# Patient Record
Sex: Male | Born: 1965 | Race: White | Hispanic: No | Smoking: Current every day smoker
Health system: Southern US, Community
[De-identification: ages and names within clinical notes are randomized; demographics above are authoritative.]

## PROBLEM LIST (undated history)

## (undated) DIAGNOSIS — I10 Essential (primary) hypertension: Secondary | ICD-10-CM

## (undated) DIAGNOSIS — N390 Urinary tract infection, site not specified: Secondary | ICD-10-CM

## (undated) DIAGNOSIS — E785 Hyperlipidemia, unspecified: Secondary | ICD-10-CM

## (undated) HISTORY — DX: Urinary tract infection, site not specified: N39.0

## (undated) HISTORY — DX: Essential (primary) hypertension: I10

## (undated) HISTORY — DX: Hyperlipidemia, unspecified: E78.5

---

## 2000-08-12 HISTORY — PX: VASECTOMY: SHX75

## 2005-02-08 ENCOUNTER — Ambulatory Visit: Payer: Self-pay | Admitting: Internal Medicine

## 2005-02-11 ENCOUNTER — Ambulatory Visit: Payer: Self-pay | Admitting: Cardiology

## 2005-02-20 ENCOUNTER — Ambulatory Visit (HOSPITAL_COMMUNITY): Admission: RE | Admit: 2005-02-20 | Discharge: 2005-02-20 | Payer: Self-pay | Admitting: Internal Medicine

## 2014-03-16 ENCOUNTER — Encounter: Payer: Self-pay | Admitting: Physician Assistant

## 2014-03-16 ENCOUNTER — Ambulatory Visit (INDEPENDENT_AMBULATORY_CARE_PROVIDER_SITE_OTHER): Payer: 59 | Admitting: Physician Assistant

## 2014-03-16 VITALS — BP 120/80 | HR 72 | Temp 97.9°F | Resp 18 | Ht 75.0 in | Wt 262.0 lb

## 2014-03-16 DIAGNOSIS — F411 Generalized anxiety disorder: Secondary | ICD-10-CM

## 2014-03-16 DIAGNOSIS — E041 Nontoxic single thyroid nodule: Secondary | ICD-10-CM

## 2014-03-16 DIAGNOSIS — M25519 Pain in unspecified shoulder: Secondary | ICD-10-CM

## 2014-03-16 DIAGNOSIS — M25512 Pain in left shoulder: Secondary | ICD-10-CM

## 2014-03-16 HISTORY — DX: Nontoxic single thyroid nodule: E04.1

## 2014-03-16 MED ORDER — BUSPIRONE HCL 7.5 MG PO TABS: 7.5000 mg | ORAL_TABLET | Freq: Two times a day (BID) | ORAL | Status: DC

## 2014-03-16 NOTE — Patient Instructions (Addendum)
For your anxiety, we will try buspirone 7-1/2 mg tabs twice daily. This is non-habit forming and has a very low side effect profile, seizure tolerated it well. We will reassess the effectiveness in about 2 weeks, and at the same time reassess your blood pressure and have a physical exam.  You'll be called to schedule an appointment to see orthopedics to evaluate your shoulder.  You'll be called to schedule an appointment to have an ultrasound of your thyroid to reassess for any nodule.  If emergency symptoms discussed during visit developed, seek medical attention immediately.  Followup about 2 weeks to reassess, or for worsening or persistent symptoms despite treatment.   Panic Attacks Panic attacks are sudden, short feelings of great fear or discomfort. You may have them for no reason when you are relaxed, when you are uneasy (anxious), or when you are sleeping.  HOME CARE  Take all your medicines as told.  Check with your doctor before starting new medicines.  Keep all doctor visits. GET HELP IF:  You are not able to take your medicines as told.  Your symptoms do not get better.  Your symptoms get worse. GET HELP RIGHT AWAY IF:  Your attacks seem different than your normal attacks.  You have thoughts about hurting yourself or others.  You take panic attack medicine and you have a side effect. MAKE SURE YOU:  Understand these instructions.  Will watch your condition.  Will get help right away if you are not doing well or get worse. Document Released: 08/31/2010 Document Revised: 05/19/2013 Document Reviewed: 03/12/2013 [facility] Patient Information 2015 [place], [place]. This information is not intended to replace advice given to you by your health care provider. Make sure you discuss any questions you have with your health care provider.

## 2014-03-16 NOTE — Progress Notes (Signed)
Subjective:    Patient ID: Michael Ritter, male    DOB: [birth year], 48 y.o.   MRN: [id number]  HPI Patient presents to clinic today to establish care.  Acute Concerns: Injured Shoulder- Left shoulder. No specific trauma, cleans carpet on the side, aggravates shoulder. States that it has been hurting for about 6 months. Hurts with certain movements. Moderate pain, sharp with movements. States he has been taking ibuprofen for this and it helps some. He also feels the shoulder pain effecting his neck on the same side and gives him a neck/headache. He denies numbness and tingling, and shooting pain down his arm or back.  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 He reported feeling better after about an hour and so he did not recheck it that night. He rechecked his BP last night around 11:30pm due to feeling a headache again, and his BP was 160/99. Today he is normotensive. He has never been on treatment for HTN. He feels that this only happens when he is at work, and feels that stress and anxiety over his job are a major culprit. He states that he starts thinking about the his job and it causes him to become more and more stressed until he develops a headache. Pt states he never has these episodes at home, only at work. Pt denies helplessness, hopelessness, SI, or HI.    Chronic Issues: Thyroid Nodule- Found after a neck injury. [place] of neck discovered a small nodule, per pt. A biopsy was recommended, however pt never had this done.    Health Maintenance: Dental -- Does not have a regular dentist. Vision -- Has reading glasses Immunizations -- UTD Colonoscopy -- No family history of colon cancer, colonoscopy at 48 y/o.     Review of Systems Patient denies fevers, chills,  nausea, vomiting, diarrhea, chest pain, shortness of breath, orthopnea, syncope. Denies lower extremity edema, abdominal pain, change in appetite, change in bowel movements. Patient denies rashes. No other specific complaints in a complete review of systems.     Past Medical History  Diagnosis Date  . Urinary tract infection     History   Social History  . Marital Status: Married    Spouse Name: N/A    Number of Children: N/A  . Years of Education: N/A   Occupational History  . Not on file.   Social History Main Topics  . Smoking status: Current Every Ritter Smoker  . Smokeless tobacco: Not on file  . Alcohol Use: No  . Drug Use: No  . Sexual Activity: Not on file   Other Topics Concern  . Not on file   Social History Narrative  . No narrative on file    Past Surgical History  Procedure Laterality Date  . Vasectomy  2002    Family History  Problem Relation Age of Onset  . Hypertension Father     Allergies  Allergen Reactions  . Penicillins     Other reaction(s): Unknown    No current outpatient prescriptions on file prior to visit.   No current facility-administered medications on file prior to visit.   The PFS was reviewed with the pt at time of visit.   EXAM: BP 120/80  Pulse  72  Temp(Src) 97.9 F (36.6 C) (Oral)  Resp 18  Ht 6\' 3"  (1.905 m)  Wt 262 lb (118.842 kg)  BMI 32.75 kg/m2  SpO2 95%     Objective:   Physical Exam  Nursing note and vitals reviewed. Constitutional: He is oriented to person, place, and time. He appears well-developed and well-nourished. No distress.  HENT:  Head: Normocephalic and atraumatic.  Eyes: Conjunctivae and EOM are normal. Pupils are equal, round, and reactive to light.  Neck: Normal range of motion. Neck supple. No thyromegaly present.  Cardiovascular: Normal rate, regular rhythm and intact distal pulses.   Pulmonary/Chest: Effort normal and breath sounds normal. No stridor. No respiratory distress. He  exhibits no tenderness.  Musculoskeletal: He exhibits no edema and no tenderness.  ROM in left shoulder limited. Pt unable to fully abduct shoulder past 90 degrees.  Lymphadenopathy:    He has no cervical adenopathy.  Neurological: He is alert and oriented to person, place, and time.  Skin: Skin is warm and dry. No rash noted. He is not diaphoretic. No erythema. No pallor.  Psychiatric: He has a normal mood and affect. His behavior is normal. Judgment and thought content normal.    No results found for this basename: WBC, HGB, HCT, PLT, GLUCOSE, CHOL, TRIG, HDL, LDLDIRECT, LDLCALC, ALT, AST, NA, K, CL, CREATININE, BUN, CO2, TSH, PSA, INR, GLUF, HGBA1C, MICROALBUR         Assessment & Plan:  Michael Ritter was seen today for establish care.  Diagnoses and associated orders for this visit:  Thyroid nodule Comments: Will obtian US to reassess. - US Soft Tissue Head/Neck; Future  Anxiety state, unspecified Comments: Probable cause of headache and high BPs at work. Will try Buspirone, follow up in 2 weeks to reassess. - busPIRone (BUSPAR) 7.5 MG tablet; Take 1 tablet (7.5 mg total) by mouth 2 (two) times daily.  Pain in joint, shoulder region, left Comments: Limited abduction. Possibly RC related. Refer to orthopedics. - Ambulatory referral to Orthopedic Surgery    Pt will schedule physical prior to leaving today.  Return precautions provided, and patient handout on panic attacks.  Plan to follow up in about 2 weeks to reassess, or for worsening or persistent symptoms despite treatment.  Patient Instructions  For your anxiety, we will try buspirone 7-1/2 mg tabs twice daily. This is non-habit forming and has a very low side effect profile, seizure tolerated it well. We will reassess the effectiveness in about 2 weeks, and at the same time reassess your blood pressure and have a physical exam.  You'll be called to schedule an appointment to see orthopedics to evaluate your  shoulder.  You'll be called to schedule an appointment to have an ultrasound of your thyroid to reassess for any nodule.  If emergency symptoms discussed during visit developed, seek medical attention immediately.  Followup about 2 weeks to reassess, or for worsening or persistent symptoms despite treatment.

## 2014-03-16 NOTE — Progress Notes (Signed)
Pre visit review using our clinic review tool, if applicable. No additional management support is needed unless otherwise documented below in the visit note. 

## 2014-04-05 ENCOUNTER — Encounter: Payer: 59 | Admitting: Physician Assistant

## 2014-04-05 DIAGNOSIS — Z0289 Encounter for other administrative examinations: Secondary | ICD-10-CM

## 2014-04-12 ENCOUNTER — Encounter (HOSPITAL_COMMUNITY): Payer: Self-pay | Admitting: Emergency Medicine

## 2014-04-12 ENCOUNTER — Emergency Department (HOSPITAL_COMMUNITY)
Admission: EM | Admit: 2014-04-12 | Discharge: 2014-04-12 | Disposition: A | Discharge status: Home / Self Care | Payer: PRIVATE HEALTH INSURANCE | Attending: Emergency Medicine | Admitting: Emergency Medicine

## 2014-04-12 ENCOUNTER — Emergency Department (HOSPITAL_COMMUNITY): Payer: PRIVATE HEALTH INSURANCE

## 2014-04-12 DIAGNOSIS — F17200 Nicotine dependence, unspecified, uncomplicated: Secondary | ICD-10-CM | POA: Diagnosis not present

## 2014-04-12 DIAGNOSIS — Y9229 Other specified public building as the place of occurrence of the external cause: Secondary | ICD-10-CM | POA: Insufficient documentation

## 2014-04-12 DIAGNOSIS — S4980XA Other specified injuries of shoulder and upper arm, unspecified arm, initial encounter: Secondary | ICD-10-CM | POA: Insufficient documentation

## 2014-04-12 DIAGNOSIS — IMO0002 Reserved for concepts with insufficient information to code with codable children: Secondary | ICD-10-CM | POA: Diagnosis not present

## 2014-04-12 DIAGNOSIS — S46909A Unspecified injury of unspecified muscle, fascia and tendon at shoulder and upper arm level, unspecified arm, initial encounter: Secondary | ICD-10-CM | POA: Diagnosis present

## 2014-04-12 DIAGNOSIS — Z87440 Personal history of urinary (tract) infections: Secondary | ICD-10-CM | POA: Insufficient documentation

## 2014-04-12 DIAGNOSIS — Z79899 Other long term (current) drug therapy: Secondary | ICD-10-CM | POA: Diagnosis not present

## 2014-04-12 DIAGNOSIS — Z880 Allergy status to penicillin: Secondary | ICD-10-CM | POA: Diagnosis not present

## 2014-04-12 DIAGNOSIS — Y9389 Activity, other specified: Secondary | ICD-10-CM | POA: Diagnosis not present

## 2014-04-12 DIAGNOSIS — S43402A Unspecified sprain of left shoulder joint, initial encounter: Secondary | ICD-10-CM

## 2014-04-12 MED ORDER — IBUPROFEN 800 MG PO TABS
800.0000 mg | ORAL_TABLET | Freq: Once | ORAL | Status: AC
  Administered 2014-04-12: 800 mg via ORAL
  Filled 2014-04-12: qty 1
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
## 2014-04-12 NOTE — ED Notes (Signed)
Patient offered ice pack or hot pack, declined at this time.

## 2014-04-12 NOTE — ED Notes (Signed)
Patient transported to X-ray 

## 2014-04-12 NOTE — Discharge Instructions (Signed)
Shoulder Sprain °A shoulder sprain is the result of damage to the tough, fiber-like tissues (ligaments) that help hold your shoulder in place. The ligaments may be stretched or torn. Besides the main shoulder joint (the ball and socket), there are several smaller joints that connect the bones in this area. A sprain usually involves one of those joints. Most often it is the acromioclavicular (or AC) joint. That is the joint that connects the collarbone (clavicle) and the shoulder blade (scapula) at the top point of the shoulder blade (acromion). °A shoulder sprain is a mild form of what is called a shoulder separation. Recovering from a shoulder sprain may take some time. For some, pain lingers for several months. Most people recover without long term problems. °CAUSES  °· A shoulder sprain is usually caused by some kind of trauma. This might be: °¨ Falling on an outstretched arm. °¨ Being hit hard on the shoulder. °¨ Twisting the arm. °· Shoulder sprains are more likely to occur in people who: °¨ Play sports. °¨ Have balance or coordination problems. °SYMPTOMS  °· Pain when you move your shoulder. °· Limited ability to move the shoulder. °· Swelling and tenderness on top of the shoulder. °· Redness or warmth in the shoulder. °· Bruising. °· A change in the shape of the shoulder. °DIAGNOSIS  °Your healthcare provider may: °· Ask about your symptoms. °· Ask about recent activity that might have caused those symptoms. °· Examine your shoulder. You may be asked to do simple exercises to test movement. The other shoulder will be examined for comparison. °· Order some tests that provide a look inside the body. They can show the extent of the injury. The tests could include: °¨ X-rays. °¨ CT (computed tomography) scan. °¨ MRI (magnetic resonance imaging) scan. °RISKS AND COMPLICATIONS °· Loss of full shoulder motion. °· Ongoing shoulder pain. °TREATMENT  °How long it takes to recover from a shoulder sprain depends on how  severe it was. Treatment options may include: °· Rest. You should not use the arm or shoulder until it heals. °· Ice. For 2 or 3 days after the injury, put an ice pack on the shoulder up to 4 times a day. It should stay on for 15 to 20 minutes each time. Wrap the ice in a towel so it does not touch your skin. °· Over-the-counter medicine to relieve pain. °· A sling or brace. This will keep the arm still while the shoulder is healing. °· Physical therapy or rehabilitation exercises. These will help you regain strength and motion. Ask your healthcare provider when it is OK to begin these exercises. °· Surgery. The need for surgery is rare with a sprained shoulder, but some people may need surgery to keep the joint in place and reduce pain. °HOME CARE INSTRUCTIONS  °· Ask your healthcare provider about what you should and should not do while your shoulder heals. °· Make sure you know how to apply ice to the correct area of your shoulder. °· Talk with your healthcare provider about which medications should be used for pain and swelling. °· If rehabilitation therapy will be needed, ask your healthcare provider to refer you to a therapist. If it is not recommended, then ask about at-home exercises. Find out when exercise should begin. °SEEK MEDICAL CARE IF:  °Your pain, swelling, or redness at the joint increases. °SEEK IMMEDIATE MEDICAL CARE IF:  °· You have a fever. °· You cannot move your arm or shoulder. °Document Released: 12/15/2008 Document   Revised: 10/21/2011 Document Reviewed: 12/15/2008 °ExitCare® Patient Information ©2015 [employer]. This information is not intended to replace advice given to you by your health care provider. Make sure you discuss any questions you have with your health care provider. ° °
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
## 2015-12-26 IMAGING — CR DG SHOULDER 2+V*L*
4 series · 4 of 4 positions shown · non-contrast
Comparison: None.

CLINICAL DATA: Left shoulder injury

EXAM:
LEFT SHOULDER - 2+ VIEW

[w shoulder internal left]
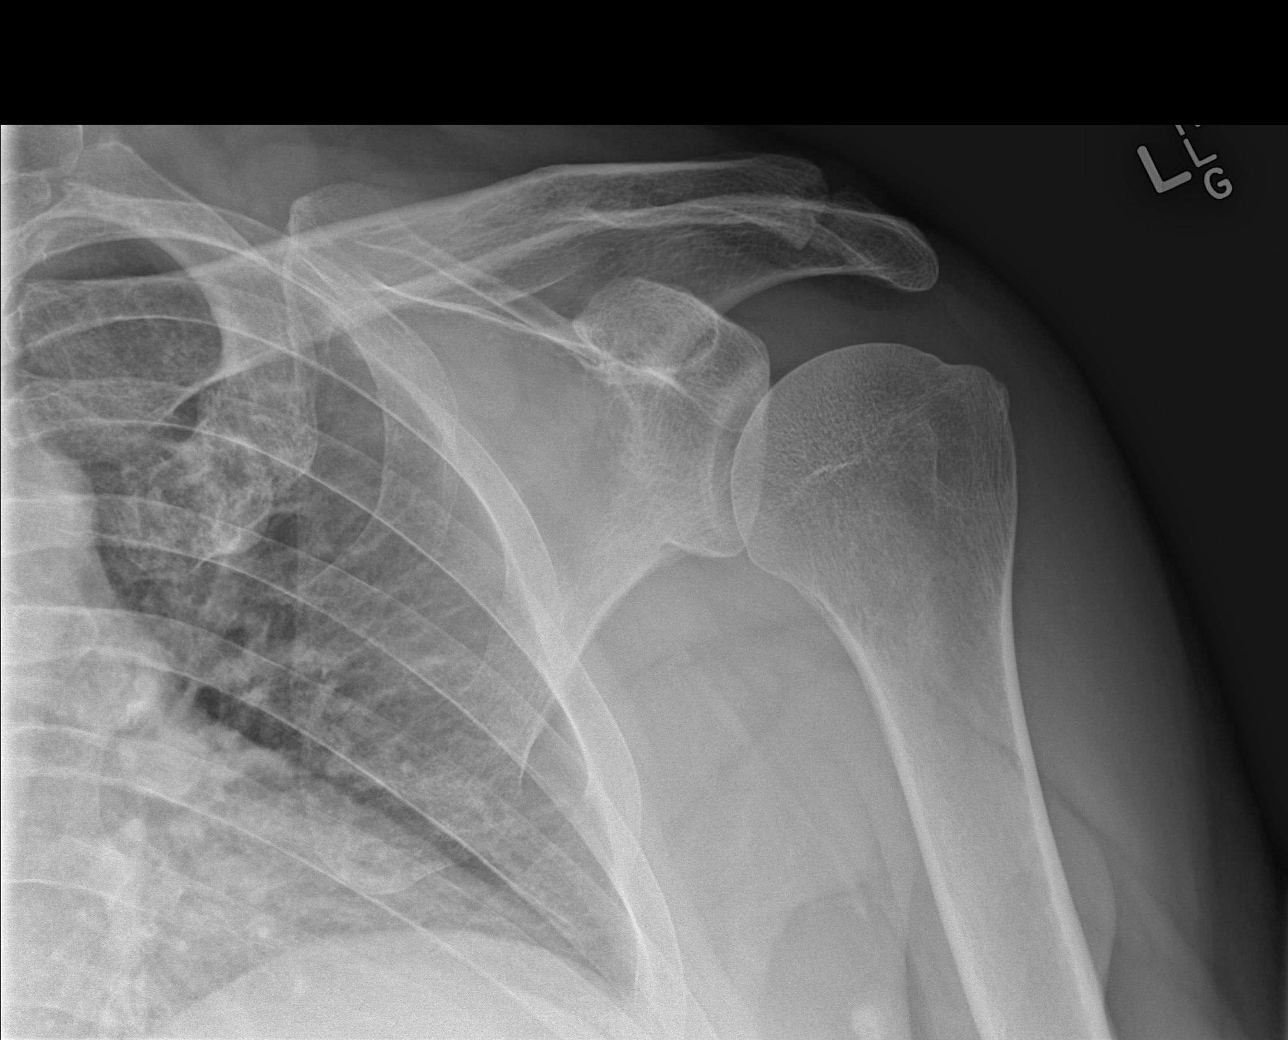

[w shoulder y-view left]
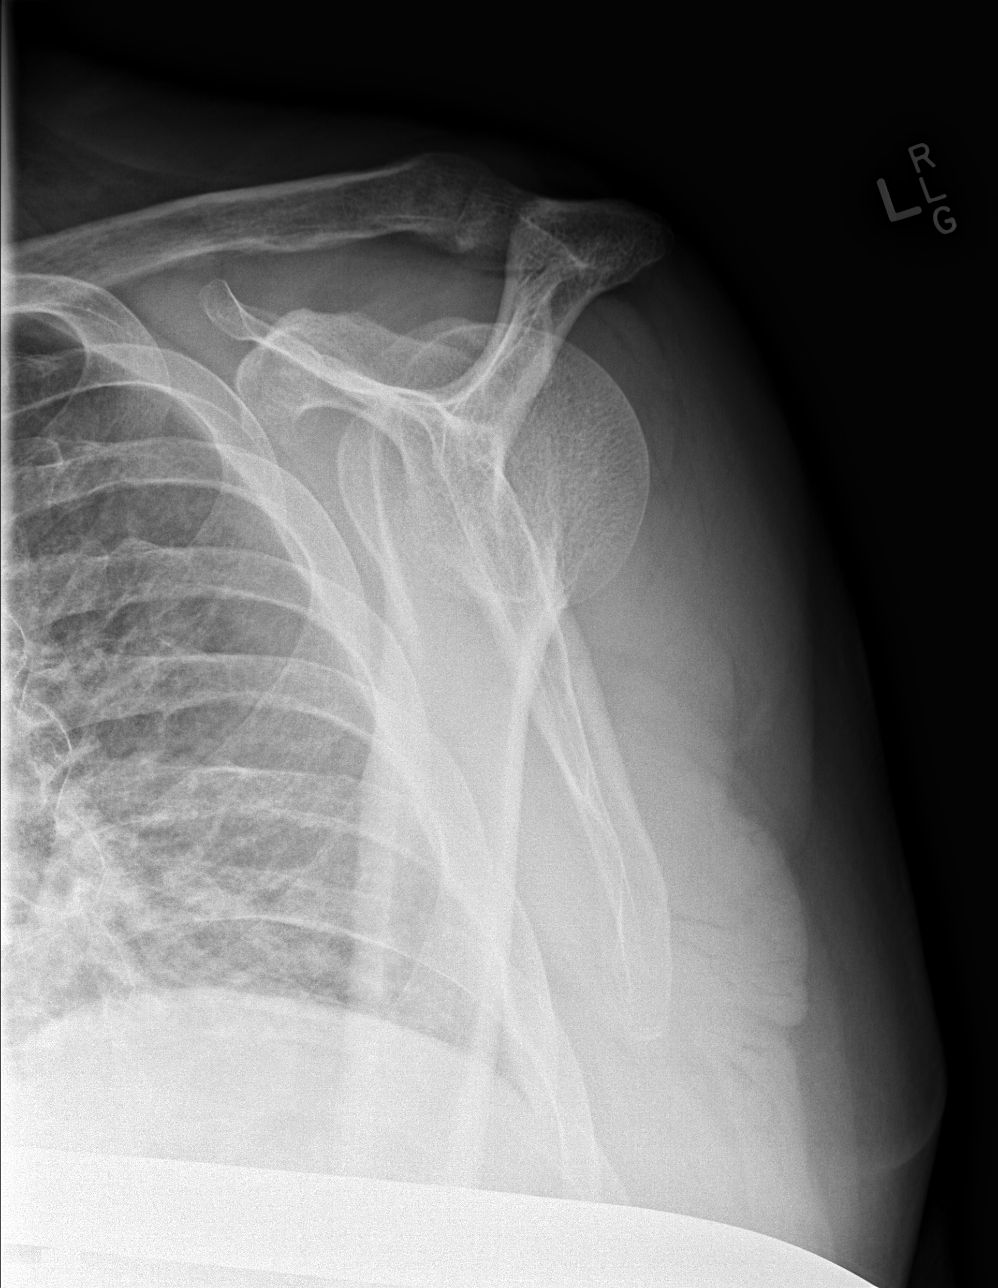

[x shoulder axillary left (1 of 2)]
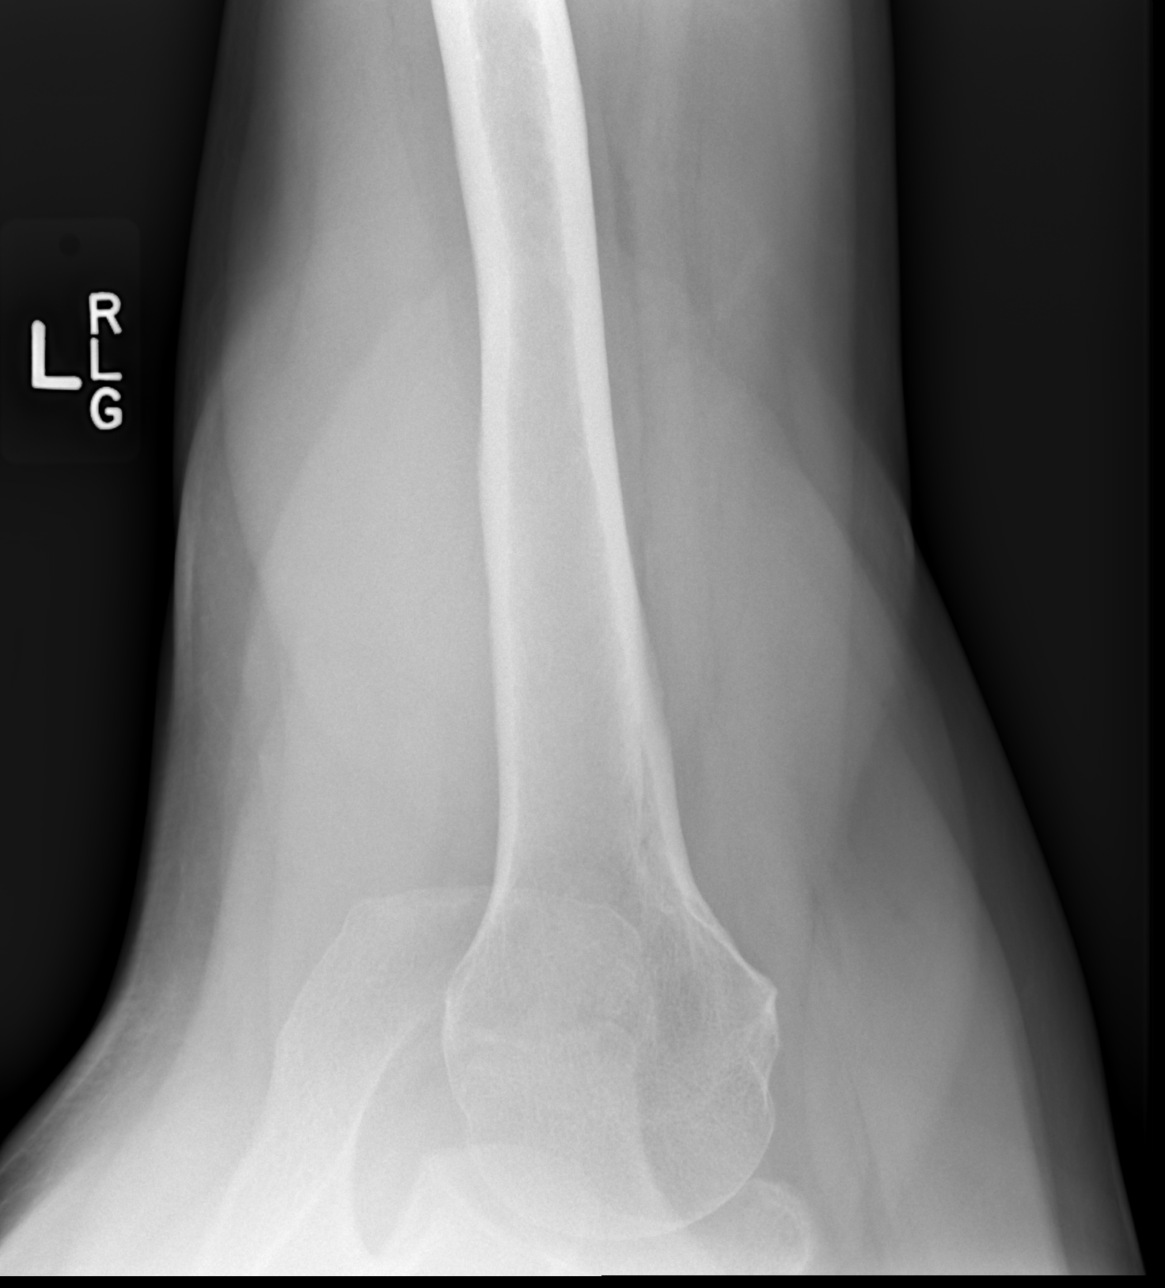

[x shoulder axillary left (2 of 2)]
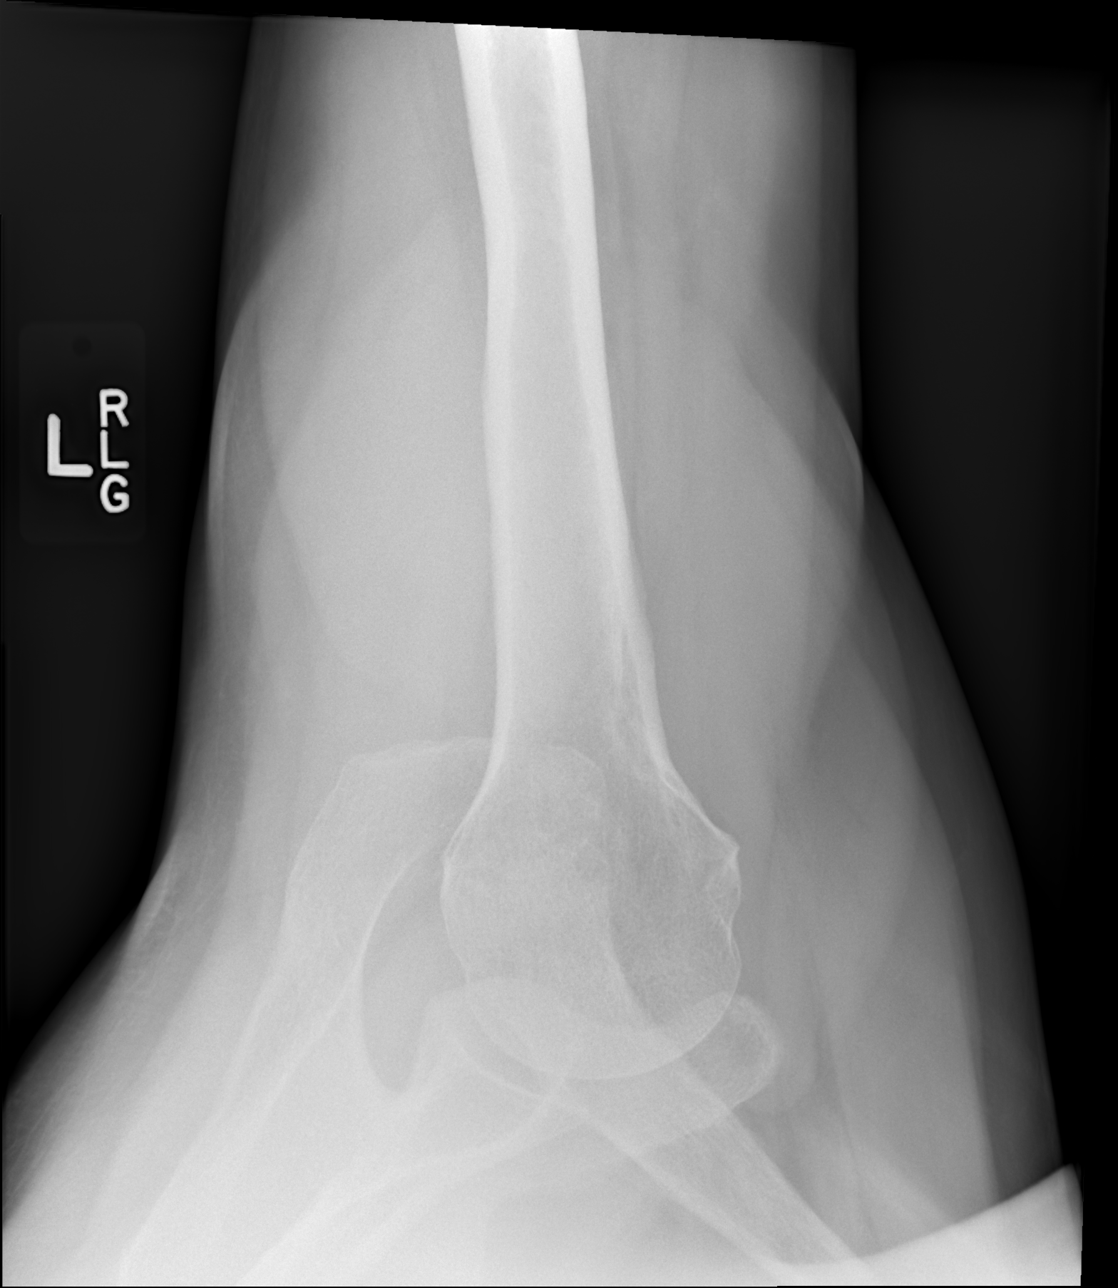

[4 of 4 positions shown; findings below may reference images not displayed]

FINDINGS: There is no evidence of fracture or dislocation. There is no
evidence of arthropathy or other focal bone abnormality. Soft
tissues are unremarkable.
IMPRESSION: Negative.

## 2023-08-14 ENCOUNTER — Inpatient Hospital Stay (HOSPITAL_COMMUNITY)
Admission: EM | Admit: 2023-08-14 | Discharge: 2023-08-15 | DRG: 065 | Disposition: A | Discharge status: Home / Self Care | Payer: Commercial Managed Care - PPO | Attending: Family Medicine | Admitting: Family Medicine

## 2023-08-14 ENCOUNTER — Emergency Department (HOSPITAL_COMMUNITY): Payer: Self-pay

## 2023-08-14 ENCOUNTER — Other Ambulatory Visit: Payer: Self-pay

## 2023-08-14 ENCOUNTER — Inpatient Hospital Stay (HOSPITAL_COMMUNITY): Payer: Commercial Managed Care - PPO

## 2023-08-14 ENCOUNTER — Encounter (HOSPITAL_COMMUNITY): Payer: Self-pay

## 2023-08-14 DIAGNOSIS — I10 Essential (primary) hypertension: Secondary | ICD-10-CM | POA: Diagnosis present

## 2023-08-14 DIAGNOSIS — Z79899 Other long term (current) drug therapy: Secondary | ICD-10-CM

## 2023-08-14 DIAGNOSIS — I6389 Other cerebral infarction: Secondary | ICD-10-CM

## 2023-08-14 DIAGNOSIS — F17210 Nicotine dependence, cigarettes, uncomplicated: Secondary | ICD-10-CM | POA: Diagnosis present

## 2023-08-14 DIAGNOSIS — I6381 Other cerebral infarction due to occlusion or stenosis of small artery: Principal | ICD-10-CM

## 2023-08-14 DIAGNOSIS — I639 Cerebral infarction, unspecified: Secondary | ICD-10-CM | POA: Diagnosis not present

## 2023-08-14 DIAGNOSIS — Z8249 Family history of ischemic heart disease and other diseases of the circulatory system: Secondary | ICD-10-CM

## 2023-08-14 DIAGNOSIS — G8191 Hemiplegia, unspecified affecting right dominant side: Secondary | ICD-10-CM | POA: Diagnosis present

## 2023-08-14 DIAGNOSIS — R0902 Hypoxemia: Secondary | ICD-10-CM | POA: Diagnosis present

## 2023-08-14 DIAGNOSIS — E119 Type 2 diabetes mellitus without complications: Secondary | ICD-10-CM

## 2023-08-14 DIAGNOSIS — R29702 NIHSS score 2: Secondary | ICD-10-CM | POA: Diagnosis present

## 2023-08-14 DIAGNOSIS — E1165 Type 2 diabetes mellitus with hyperglycemia: Secondary | ICD-10-CM | POA: Diagnosis present

## 2023-08-14 DIAGNOSIS — Z716 Tobacco abuse counseling: Secondary | ICD-10-CM

## 2023-08-14 DIAGNOSIS — R4781 Slurred speech: Secondary | ICD-10-CM | POA: Diagnosis present

## 2023-08-14 DIAGNOSIS — Z7902 Long term (current) use of antithrombotics/antiplatelets: Secondary | ICD-10-CM

## 2023-08-14 DIAGNOSIS — Z8673 Personal history of transient ischemic attack (TIA), and cerebral infarction without residual deficits: Secondary | ICD-10-CM | POA: Diagnosis present

## 2023-08-14 DIAGNOSIS — Z7982 Long term (current) use of aspirin: Secondary | ICD-10-CM

## 2023-08-14 DIAGNOSIS — R030 Elevated blood-pressure reading, without diagnosis of hypertension: Secondary | ICD-10-CM | POA: Insufficient documentation

## 2023-08-14 DIAGNOSIS — Z880 Allergy status to penicillin: Secondary | ICD-10-CM

## 2023-08-14 HISTORY — DX: Cerebral infarction, unspecified: I63.9

## 2023-08-14 LAB — COMPREHENSIVE METABOLIC PANEL
ALT: 35 U/L (ref 0–44)
AST: 30 U/L (ref 15–41)
Albumin: 4.4 g/dL (ref 3.5–5.0)
Alkaline Phosphatase: 68 U/L (ref 38–126)
Anion gap: 8 (ref 5–15)
BUN: 15 mg/dL (ref 6–20)
CO2: 24 mmol/L (ref 22–32)
Calcium: 9.5 mg/dL (ref 8.9–10.3)
Chloride: 103 mmol/L (ref 98–111)
Creatinine, Ser: 0.96 mg/dL (ref 0.61–1.24)
GFR, Estimated: >60 mL/min (ref >60)
Glucose, Bld: 171 mg/dL — ABNORMAL HIGH (ref 70–99)
Potassium: 3.8 mmol/L (ref 3.5–5.1)
Sodium: 135 mmol/L (ref 135–145)
Total Bilirubin: 0.7 mg/dL (ref 0.0–1.2)
Total Protein: 8.2 g/dL — ABNORMAL HIGH (ref 6.5–8.1)

## 2023-08-14 LAB — CBG MONITORING, ED: Glucose-Capillary: 169 mg/dL — ABNORMAL HIGH (ref 70–99)

## 2023-08-14 LAB — ECHOCARDIOGRAM COMPLETE
Area-P 1/2: 4.68 cm2
Calc EF: 57.8 %
Est EF: 55
S' Lateral: 3.5 cm
Single Plane A2C EF: 53.1 %
Single Plane A4C EF: 61 %

## 2023-08-14 LAB — I-STAT CHEM 8, ED
BUN: 14 mg/dL (ref 6–20)
Calcium, Ion: 1.18 mmol/L (ref 1.15–1.40)
Chloride: 104 mmol/L (ref 98–111)
Creatinine, Ser: 1 mg/dL (ref 0.61–1.24)
Glucose, Bld: 166 mg/dL — ABNORMAL HIGH (ref 70–99)
HCT: 47 % (ref 39.0–52.0)
Hemoglobin: 16 g/dL (ref 13.0–17.0)
Potassium: 4 mmol/L (ref 3.5–5.1)
Sodium: 141 mmol/L (ref 135–145)
TCO2: 24 mmol/L (ref 22–32)

## 2023-08-14 LAB — DIFFERENTIAL
Abs Immature Granulocytes: 0.05 10*3/uL (ref 0.00–0.07)
Basophils Absolute: 0.1 10*3/uL (ref 0.0–0.1)
Basophils Relative: 1 %
Eosinophils Absolute: 0.3 10*3/uL (ref 0.0–0.5)
Eosinophils Relative: 3 %
Immature Granulocytes: 1 %
Lymphocytes Relative: 29 %
Lymphs Abs: 2.9 10*3/uL (ref 0.7–4.0)
Monocytes Absolute: 0.7 10*3/uL (ref 0.1–1.0)
Monocytes Relative: 7 %
Neutro Abs: 6.1 10*3/uL (ref 1.7–7.7)
Neutrophils Relative %: 59 %

## 2023-08-14 LAB — CBC
HCT: 50.5 % (ref 39.0–52.0)
Hemoglobin: 16.8 g/dL (ref 13.0–17.0)
MCH: 30.3 pg (ref 26.0–34.0)
MCHC: 33.3 g/dL (ref 30.0–36.0)
MCV: 91.2 fL (ref 80.0–100.0)
Platelets: 226 10*3/uL (ref 150–400)
RBC: 5.54 MIL/uL (ref 4.22–5.81)
RDW: 13.5 % (ref 11.5–15.5)
WBC: 10 10*3/uL (ref 4.0–10.5)
nRBC: 0 % (ref 0.0–0.2)

## 2023-08-14 LAB — APTT: aPTT: 24 s (ref 24–36)

## 2023-08-14 LAB — PROTIME-INR
INR: 0.9 (ref 0.8–1.2)
Prothrombin Time: 12.8 s (ref 11.4–15.2)

## 2023-08-14 LAB — ETHANOL: Alcohol, Ethyl (B): <10 mg/dL (ref <10)

## 2023-08-14 LAB — LIPID PANEL
Cholesterol: 208 mg/dL — ABNORMAL HIGH (ref 0–200)
HDL: 46 mg/dL (ref >40)
LDL Cholesterol: 141 mg/dL — ABNORMAL HIGH (ref 0–99)
Total CHOL/HDL Ratio: 4.5 {ratio}
Triglycerides: 106 mg/dL (ref <150)
VLDL: 21 mg/dL (ref 0–40)

## 2023-08-14 MED ORDER — ACETAMINOPHEN 325 MG PO TABS
650.0000 mg | ORAL_TABLET | ORAL | Status: DC | PRN
Start: 2023-08-14 — End: 2023-08-15

## 2023-08-14 MED ORDER — CLOPIDOGREL BISULFATE 75 MG PO TABS
75.0000 mg | ORAL_TABLET | Freq: Every day | ORAL | Status: DC
  Administered 2023-08-14 – 2023-08-15 (×2): 75 mg via ORAL
  Filled 2023-08-14 (×2): qty 1

## 2023-08-14 MED ORDER — ASPIRIN 81 MG PO CHEW
81.0000 mg | CHEWABLE_TABLET | Freq: Every day | ORAL | Status: DC
  Administered 2023-08-14 – 2023-08-15 (×2): 81 mg via ORAL
  Filled 2023-08-14 (×2): qty 1

## 2023-08-14 MED ORDER — SODIUM CHLORIDE 0.9% FLUSH
3.0000 mL | Freq: Once | INTRAVENOUS | Status: AC
  Administered 2023-08-14: 3 mL via INTRAVENOUS

## 2023-08-14 MED ORDER — SENNOSIDES-DOCUSATE SODIUM 8.6-50 MG PO TABS: 1.0000 | ORAL_TABLET | Freq: Every evening | ORAL | Status: DC | PRN

## 2023-08-14 MED ORDER — IBUPROFEN 200 MG PO TABS
600.0000 mg | ORAL_TABLET | Freq: Four times a day (QID) | ORAL | Status: DC | PRN
  Filled 2023-08-14: qty 3

## 2023-08-14 MED ORDER — ATORVASTATIN CALCIUM 40 MG PO TABS
80.0000 mg | ORAL_TABLET | Freq: Every day | ORAL | Status: DC
  Administered 2023-08-14 – 2023-08-15 (×2): 80 mg via ORAL
  Filled 2023-08-14 (×2): qty 2

## 2023-08-14 MED ORDER — IOHEXOL 350 MG/ML SOLN
75.0000 mL | Freq: Once | INTRAVENOUS | Status: AC | PRN
  Administered 2023-08-14: 75 mL via INTRAVENOUS

## 2023-08-14 MED ORDER — ACETAMINOPHEN 160 MG/5ML PO SOLN
650.0000 mg | ORAL | Status: DC | PRN
Start: 2023-08-14 — End: 2023-08-15

## 2023-08-14 MED ORDER — SODIUM CHLORIDE (PF) 0.9 % IJ SOLN
INTRAMUSCULAR | Status: AC
Start: 2023-08-14 — End: ?
  Filled 2023-08-14: qty 50

## 2023-08-14 MED ORDER — ONDANSETRON HCL 4 MG/2ML IJ SOLN: 4.0000 mg | Freq: Four times a day (QID) | INTRAMUSCULAR | Status: DC | PRN

## 2023-08-14 MED ORDER — ACETAMINOPHEN 650 MG RE SUPP: 650.0000 mg | RECTAL | Status: DC | PRN

## 2023-08-14 MED ORDER — SODIUM CHLORIDE 0.9 % IV BOLUS
500.0000 mL | Freq: Once | INTRAVENOUS | Status: AC
  Administered 2023-08-14: 500 mL via INTRAVENOUS

## 2023-08-14 MED ORDER — STROKE: EARLY STAGES OF RECOVERY BOOK
Freq: Once | Status: DC
  Filled 2023-08-14: qty 1

## 2023-08-14 MED ORDER — ENOXAPARIN SODIUM 40 MG/0.4ML IJ SOSY
40.0000 mg | PREFILLED_SYRINGE | Freq: Every day | INTRAMUSCULAR | Status: DC
  Administered 2023-08-14 – 2023-08-15 (×2): 40 mg via SUBCUTANEOUS
  Filled 2023-08-14 (×2): qty 0.4

## 2023-08-14 MED ORDER — ASPIRIN 300 MG RE SUPP: 300.0000 mg | Freq: Every day | RECTAL | Status: DC

## 2023-08-14 MED ORDER — PERFLUTREN LIPID MICROSPHERE
1.0000 mL | INTRAVENOUS | Status: AC | PRN
  Administered 2023-08-14: 1 mL via INTRAVENOUS

## 2023-08-14 NOTE — ED Notes (Signed)
 Pt to CT

## 2023-08-14 NOTE — ED Provider Notes (Signed)
 [place] EMERGENCY DEPARTMENT AT [facility] Provider Note  CSN: [id number] Arrival date & time: 08/14/23 0846  Chief Complaint(s) Stroke Symptoms  HPI Michael Ritter is a 58 y.o. male with past medical history as below, significant for hypertension, vasectomy, Bacot use who presents to the ED with complaint of speech changes  Last seen normal around 7:30 - 8 PM last night, began having difficulty speaking, slurred speech.  Felt like he was slightly weak on the right side.  No falls or injuries, no thinners.  Denies similar symptoms in the past.  Symptoms have not improved since the onset.  He went bed and symptoms persisted this morning.  He also now has a slight headache left frontal.  Slurred speech continues, no vision changes, no aphasia or neglect on exam.  Past Medical History Past Medical History:  Diagnosis Date   Urinary tract infection    Patient Active Problem List   Diagnosis Date Noted   Acute CVA (cerebrovascular accident) (HCC) 08/14/2023   Thyroid nodule 03/16/2014   Home Medication(s) Prior to Admission medications   Medication Sig Start Date End Date Taking? Authorizing Provider  ibuprofen  (ADVIL ,MOTRIN ) 200 MG tablet Take 800 mg by mouth every 6 (six) hours as needed for moderate pain.   Yes [provider]  Multiple Vitamin (MULTIVITAMIN WITH MINERALS) TABS tablet Take 1 tablet by mouth daily.   Yes [provider]  busPIRone  (BUSPAR ) 7.5 MG tablet Take 1 tablet (7.5 mg total) by mouth 2 (two) times daily. Patient not taking: Reported on 08/14/2023 03/16/14   Viktoria Donnice POUR, PA-C                                                                                                                                    Past Surgical History Past Surgical History:  Procedure Laterality Date   VASECTOMY  2002   Family History Family History  Problem Relation Age of Onset   Hypertension Father     Social History Social History    Tobacco Use   Smoking status: Every Day  Substance Use Topics   Alcohol use: No   Drug use: No   Allergies Penicillins  Review of Systems Review of Systems  Respiratory:  Negative for chest tightness.   Cardiovascular:  Negative for chest pain.  Gastrointestinal:  Negative for abdominal pain.  Genitourinary:  Negative for urgency.  Musculoskeletal:  Negative for arthralgias.  Neurological:  Positive for facial asymmetry, speech difficulty, weakness and headaches.  All other systems reviewed and are negative.   Physical Exam Vital Signs  I have reviewed the triage vital signs BP (!) 201/106   Pulse 79   Temp 98.3 F (36.8 C) (Oral)   Resp 14   SpO2 95%  Physical Exam Vitals and nursing note reviewed.  Constitutional:      General: He is not in acute distress.    Appearance: He is well-developed.  HENT:     Head: Normocephalic and atraumatic.     Right Ear: External ear normal.     Left Ear: External ear normal.     Mouth/Throat:     Mouth: Mucous membranes are moist.  Eyes:     General: No visual field deficit or scleral icterus.    Extraocular Movements: Extraocular movements intact.     Pupils: Pupils are equal, round, and reactive to light.  Cardiovascular:     Rate and Rhythm: Normal rate and regular rhythm.     Pulses: Normal pulses.     Heart sounds: Normal heart sounds.  Pulmonary:     Effort: Pulmonary effort is normal. No respiratory distress.     Breath sounds: Normal breath sounds.  Abdominal:     General: Abdomen is flat.     Palpations: Abdomen is soft.     Tenderness: There is no abdominal tenderness.  Musculoskeletal:     Cervical back: No rigidity.     Right lower leg: No edema.     Left lower leg: No edema.  Skin:    General: Skin is warm and dry.     Capillary Refill: Capillary refill takes less than 2 seconds.  Neurological:     Mental Status: He is alert and oriented to person, place, and time.     GCS: GCS eye subscore is 4. GCS  verbal subscore is 5. GCS motor subscore is 6.     Cranial Nerves: Dysarthria and facial asymmetry present.     Sensory: Sensation is intact. No sensory deficit.     Motor: Weakness present.     Coordination: Coordination is intact. Finger-Nose-Finger Test normal.     Comments: Left-sided facial droop No visual field cuts Slight weakness to right upper extremity, slight drift Appears to be leaning to the right when ambulating  Psychiatric:        Mood and Affect: Mood normal.        Behavior: Behavior normal.     ED Results and Treatments Labs (all labs ordered are listed, but only abnormal results are displayed) Labs Reviewed  COMPREHENSIVE METABOLIC PANEL - Abnormal; Notable for the following components:      Result Value   Glucose, Bld 171 (*)    Total Protein 8.2 (*)    All other components within normal limits  LIPID PANEL - Abnormal; Notable for the following components:   Cholesterol 208 (*)    LDL Cholesterol 141 (*)    All other components within normal limits  CBG MONITORING, ED - Abnormal; Notable for the following components:   Glucose-Capillary 169 (*)    All other components within normal limits  I-STAT CHEM 8, ED - Abnormal; Notable for the following components:   Glucose, Bld 166 (*)    All other components within normal limits  PROTIME-INR  APTT  CBC  DIFFERENTIAL  ETHANOL  HEMOGLOBIN A1C  HIV ANTIBODY (ROUTINE TESTING W REFLEX)  CBG MONITORING, ED  Radiology ECHOCARDIOGRAM COMPLETE Result Date: 08/14/2023    ECHOCARDIOGRAM REPORT   Patient Name:   Michael Ritter Date of Exam: 08/14/2023 Medical Rec #:  [id number]       Height:       75.0 in Accession #:    [id number]      Weight:       262.0 lb Date of Birth:  03-17-1966      BSA:          2.461 m Patient Age:    57 years        BP:           180/95 mmHg Patient Gender: M                HR:           78 bpm. Exam Location:  Inpatient Procedure: 2D Echo, Cardiac Doppler, Color Doppler, Saline Contrast Bubble Study            and Intracardiac Opacification Agent Indications:    CVA  History:        Patient has no prior history of Echocardiogram examinations.                 Risk Factors:Current Smoker.  Sonographer:    Lanell Maduro Referring Phys: [id number] MIR M IKRAMULLAH IMPRESSIONS  1. Left ventricular ejection fraction, by estimation, is 55%. The left ventricle has normal function. The left ventricle has no regional wall motion abnormalities. There is moderate concentric left ventricular hypertrophy. Left ventricular diastolic parameters are consistent with Grade I diastolic dysfunction (impaired relaxation).  2. The IVC was not visualized. Right ventricular systolic function is normal. The right ventricular size is normal. Tricuspid regurgitation signal is inadequate for assessing PA pressure.  3. The mitral valve is normal in structure. No evidence of mitral valve regurgitation. No evidence of mitral stenosis.  4. The aortic valve is tricuspid. Aortic valve regurgitation is not visualized. No aortic stenosis is present.  5. Aortic dilatation noted. There is mild dilatation of the ascending aorta, measuring 38 mm.  6. Bubble study was probably negative but images were poor.  7. Technically difficult study with poor acoustic windows. FINDINGS  Left Ventricle: Left ventricular ejection fraction, by estimation, is 55%. The left ventricle has normal function. The left ventricle has no regional wall motion abnormalities. Definity  contrast agent was given IV to delineate the left ventricular endocardial borders. The left ventricular internal cavity size was normal in size. There is moderate concentric left ventricular hypertrophy. Left ventricular diastolic parameters are consistent with Grade I diastolic dysfunction (impaired relaxation). Right Ventricle: The IVC was not visualized. The right  ventricular size is normal. Right vetricular wall thickness was not well visualized. Right ventricular systolic function is normal. Tricuspid regurgitation signal is inadequate for assessing PA pressure. Left Atrium: Left atrial size was normal in size. Right Atrium: Right atrial size was normal in size. Pericardium: There is no evidence of pericardial effusion. Mitral Valve: The mitral valve is normal in structure. No evidence of mitral valve regurgitation. No evidence of mitral valve stenosis. Tricuspid Valve: The tricuspid valve is normal in structure. Tricuspid valve regurgitation is trivial. Aortic Valve: The aortic valve is tricuspid. Aortic valve regurgitation is not visualized. No aortic stenosis is present. Pulmonic Valve: The pulmonic valve was normal in structure. Pulmonic valve regurgitation is not visualized. Aorta: The aortic root is normal in size and structure and aortic dilatation noted. There is mild dilatation of the ascending aorta, measuring  38 mm. Venous: The inferior vena cava was not well visualized. IAS/Shunts: Bubble study was probably negative but images were poor. Agitated saline contrast was given intravenously to evaluate for intracardiac shunting.  LEFT VENTRICLE PLAX 2D LVIDd:         4.40 cm      Diastology LVIDs:         3.50 cm      LV e' medial:    5.87 cm/s LV PW:         1.50 cm      LV E/e' medial:  8.9 LV IVS:        1.60 cm      LV e' lateral:   5.11 cm/s LVOT diam:     2.50 cm      LV E/e' lateral: 10.3 LV SV:         97 LV SV Index:   39 LVOT Area:     4.91 cm  LV Volumes (MOD) LV vol d, MOD A2C: 125.0 ml LV vol d, MOD A4C: 144.0 ml LV vol s, MOD A2C: 58.6 ml LV vol s, MOD A4C: 56.1 ml LV SV MOD A2C:     66.4 ml LV SV MOD A4C:     144.0 ml LV SV MOD BP:      78.6 ml RIGHT VENTRICLE RV Basal diam:  3.40 cm RV S prime:     11.40 cm/s TAPSE (M-mode): 2.9 cm LEFT ATRIUM             Index        RIGHT ATRIUM           Index LA diam:        4.00 cm 1.63 cm/m   RA Area:      11.60 cm LA Vol (A2C):   44.1 ml 17.92 ml/m  RA Volume:   22.10 ml  8.98 ml/m LA Vol (A4C):   28.1 ml 11.42 ml/m LA Biplane Vol: 36.0 ml 14.63 ml/m  AORTIC VALVE LVOT Vmax:   106.00 cm/s LVOT Vmean:  72.300 cm/s LVOT VTI:    0.198 m  AORTA Ao Root diam: 3.40 cm Ao Asc diam:  3.80 cm MITRAL VALVE MV Area (PHT): 4.68 cm    SHUNTS MV Decel Time: 162 msec    Systemic VTI:  0.20 m MV E velocity: 52.40 cm/s  Systemic Diam: 2.50 cm MV A velocity: 99.00 cm/s MV E/A ratio:  0.53 Dalton McleanMD Electronically signed by Ezra Kanner Signature Date/Time: 08/14/2023/5:38:42 PM    Final    MR BRAIN WO CONTRAST Result Date: 08/14/2023 CLINICAL DATA:  Neuro deficit, acute, stroke suspected. Slurred speech and right-sided weakness. Headache. EXAM: MRI HEAD WITHOUT CONTRAST TECHNIQUE: Multiplanar, multiecho pulse sequences of the brain and surrounding structures were obtained without intravenous contrast. COMPARISON:  Head CT and CTA 08/14/2023 FINDINGS: Brain: There is a 1.5 cm acute infarct involving the posterior limb of the left internal capsule, corresponding to the asymmetric hypodensity described on today's CT. Scattered punctate T2 hyperintensities elsewhere in the cerebral white matter bilaterally are nonspecific but compatible with minimal chronic small vessel ischemic disease. Cerebral volume is within normal limits for age with normal size of the ventricles. No intracranial hemorrhage, mass, midline shift, or extra-axial fluid collection is identified. Vascular: Major intracranial vascular flow voids are preserved. Skull and upper cervical spine: Unremarkable bone marrow signal. Sinuses/Orbits: Unremarkable orbits. Moderate mucosal thickening in the paranasal sinuses. Clear mastoid air cells. Other: None. IMPRESSION: Acute left internal  capsule infarct. Electronically Signed   By: Dasie Hamburg M.D.   On: 08/14/2023 13:17   CT ANGIO HEAD NECK W WO CM Result Date: 08/14/2023 CLINICAL DATA:  Neuro deficit with  acute stroke suspected EXAM: CT ANGIOGRAPHY HEAD AND NECK WITH AND WITHOUT CONTRAST TECHNIQUE: Multidetector CT imaging of the head and neck was performed using the standard protocol during bolus administration of intravenous contrast. Multiplanar CT image reconstructions and MIPs were obtained to evaluate the vascular anatomy. Carotid stenosis measurements (when applicable) are obtained utilizing NASCET criteria, using the distal internal carotid diameter as the denominator. RADIATION DOSE REDUCTION: This exam was performed according to the departmental dose-optimization program which includes automated exposure control, adjustment of the mA and/or kV according to patient size and/or use of iterative reconstruction technique. CONTRAST:  75mL OMNIPAQUE  IOHEXOL  350 MG/ML SOLN COMPARISON:  Head CT from earlier today. FINDINGS: CTA NECK FINDINGS Aortic arch: Unremarkable with 3 vessel branching Right carotid system: Atheromatous wall thickening of the common internal carotid arteries. Mixed density plaque accentuated at the bifurcation without significant stenosis and no ulceration. No beading or dissection. Left carotid system: Atheromatous wall thickening of the common carotid with accentuated mixed density plaque at the bifurcation. No stenosis or ulceration. Vertebral arteries: The vertebral arteries are smoothly contoured and diffusely patent. No proximal subclavian stenosis. Skeleton: Unremarkable Other neck: Patchy bilateral paranasal sinus opacification, incidental to the history. Upper chest: No acute finding Review of the MIP images confirms the above findings CTA HEAD FINDINGS Anterior circulation: No significant stenosis, proximal occlusion, aneurysm, or vascular malformation. Generalized atheromatous type irregularity of medium size branches. Posterior circulation: Mild atheromatous irregularity of the posterior cerebral artery especially on the right where there is a mild-to-moderate P3 stenosis. Venous  sinuses: Unremarkable Anatomic variants: None significant Review of the MIP images confirms the above findings IMPRESSION: 1. No emergent finding. 2. Cervical and intracranial atherosclerosis without flow reducing stenosis or ulceration of major arteries in the head and neck. Electronically Signed   By: Dorn Roulette M.D.   On: 08/14/2023 11:28   CT HEAD WO CONTRAST Result Date: 08/14/2023 CLINICAL DATA:  Neuro deficit, acute, stroke suspected. Slurred speech and right-sided weakness. EXAM: CT HEAD WITHOUT CONTRAST TECHNIQUE: Contiguous axial images were obtained from the base of the skull through the vertex without intravenous contrast. RADIATION DOSE REDUCTION: This exam was performed according to the departmental dose-optimization program which includes automated exposure control, adjustment of the mA and/or kV according to patient size and/or use of iterative reconstruction technique. COMPARISON:  None Available. FINDINGS: Brain: No acute hemorrhage. Possible asymmetric hypoattenuation in the posterior limb of the left internal capsule (axial image 18 series 2). Cortical Annison Birchard-white differentiation is preserved. No hydrocephalus or extra-axial collection. No mass effect or midline shift. Vascular: No hyperdense vessel or unexpected calcification. Skull: No calvarial fracture or suspicious bone lesion. Skull base is unremarkable. Sinuses/Orbits: Mild pansinus disease.  Orbits are unremarkable. Other: None. IMPRESSION: 1. Possible asymmetric hypoattenuation in the posterior limb of the left internal capsule, which could represent an age-indeterminate infarct. Consider MRI for further evaluation. 2. No acute hemorrhage. Electronically Signed   By: Ryan Chess M.D.   On: 08/14/2023 09:23    Pertinent labs & imaging results that were available during my care of the patient were reviewed by me and considered in my medical decision making (see MDM for details).  Medications Ordered in ED Medications    stroke: early stages of recovery book (has no administration in time range)  acetaminophen  (  TYLENOL ) tablet 650 mg (has no administration in time range)    Or  acetaminophen  (TYLENOL ) 160 MG/5ML solution 650 mg (has no administration in time range)    Or  acetaminophen  (TYLENOL ) suppository 650 mg (has no administration in time range)  senna-docusate (Senokot-S) tablet 1 tablet (has no administration in time range)  enoxaparin  (LOVENOX ) injection 40 mg (40 mg Subcutaneous Given 08/14/23 1739)  ondansetron  (ZOFRAN ) injection 4 mg (has no administration in time range)  perflutren  lipid microspheres (DEFINITY ) IV suspension (1 mL Intravenous Given 08/14/23 1539)  aspirin  chewable tablet 81 mg (81 mg Oral Given 08/14/23 1738)    Or  aspirin  suppository 300 mg ( Rectal See Alternative 08/14/23 1738)  clopidogrel  (PLAVIX ) tablet 75 mg (75 mg Oral Given 08/14/23 1738)  atorvastatin  (LIPITOR) tablet 80 mg (80 mg Oral Given 08/14/23 1738)  ibuprofen  (ADVIL ) tablet 600 mg (has no administration in time range)  sodium chloride  flush (NS) 0.9 % injection 3 mL (3 mLs Intravenous Given 08/14/23 0934)  sodium chloride  0.9 % bolus 500 mL (0 mLs Intravenous Stopped 08/14/23 1015)  iohexol  (OMNIPAQUE ) 350 MG/ML injection 75 mL (75 mLs Intravenous Contrast Given 08/14/23 1040)                                                                                                                                     Procedures .Critical Care  Performed by: Elnor Jayson LABOR, DO Authorized by: Elnor Jayson LABOR, DO   Critical care provider statement:    Critical care time (minutes):  30   Critical care time was exclusive of:  Separately billable procedures and treating other patients   Critical care was necessary to treat or prevent imminent or life-threatening deterioration of the following conditions:  CNS failure or compromise   Critical care was time spent personally by me on the following activities:  Development of treatment plan  with patient or surrogate, discussions with consultants, evaluation of patient's response to treatment, examination of patient, ordering and review of laboratory studies, ordering and review of radiographic studies, ordering and performing treatments and interventions, pulse oximetry, re-evaluation of patient's condition, review of old charts and obtaining history from patient or surrogate   Care discussed with: admitting provider     (including critical care time)  Medical Decision Making / ED Course    Medical Decision Making:    BRENDEN RUDMAN is a 57 y.o. male with past medical history as below, significant for hypertension, vasectomy, Bacot use who presents to the ED with complaint of speech changes. The complaint involves an extensive differential diagnosis and also carries with it a high risk of complications and morbidity.  Serious etiology was considered. Ddx includes but is not limited to: CVA, complicated migraine, medication affect, metabolic abnormality, infection, infarction, etc.  Complete initial physical exam performed, notably the patient was in no distress, airway is patent.    Reviewed and confirmed nursing documentation  for past medical history, family history, social history.  Vital signs reviewed.    Presentation concerning for CVA, he is outside of window for TNK given symptom duration.  He is not Fleeta positive (he does have slight weakness RUE but no vision changes/ aphasia/ neglect), suspicion  for LVO was reduced.  Will order stroke bundle, will not activate code stroke at this time  Clinical Course as of 08/14/23 1804  Thu Aug 14, 2023  1022 Pending neurology callback; will repage [SG]  1044 Spoke with neurology, Dr Vanessa, he is with an emergent patient hence the delay in callback.  [SG]  1223 CTH w/ ?left internal capsule stroke. CTA w/o LVO [SG]    Clinical Course User Index [SG] Elnor Jayson LABOR, DO    Brief summary: 58 year old male history above  including tobacco use and hypertension here with strokelike symptoms, sore throat, weakness since 8:00 last night.  Last known normal was 8 PM yesterday.  Labs reviewed these are stable, imaging concerning for acute CVA of the left internal capsule.  Discussed with neurology Dr Vanessa, patient is okay to stay here. See neuro consult note.  Admit to [facility], Dr Zella              Additional history obtained: -Additional history obtained from family -External records from outside source obtained and reviewed including: Chart review including previous notes, labs, imaging, consultation notes including  Prior ED visit, home medications, prior imaging   Lab Tests: -I ordered, reviewed, and interpreted labs.   The pertinent results include:   Labs Reviewed  COMPREHENSIVE METABOLIC PANEL - Abnormal; Notable for the following components:      Result Value   Glucose, Bld 171 (*)    Total Protein 8.2 (*)    All other components within normal limits  LIPID PANEL - Abnormal; Notable for the following components:   Cholesterol 208 (*)    LDL Cholesterol 141 (*)    All other components within normal limits  CBG MONITORING, ED - Abnormal; Notable for the following components:   Glucose-Capillary 169 (*)    All other components within normal limits  I-STAT CHEM 8, ED - Abnormal; Notable for the following components:   Glucose, Bld 166 (*)    All other components within normal limits  PROTIME-INR  APTT  CBC  DIFFERENTIAL  ETHANOL  HEMOGLOBIN A1C  HIV ANTIBODY (ROUTINE TESTING W REFLEX)  CBG MONITORING, ED    Notable for stable labs  EKG   EKG Interpretation Date/Time:  Thursday August 14 2023 08:55:52 EST Ventricular Rate:  104 PR Interval:  158 QRS Duration:  95 QT Interval:  363 QTC Calculation: 476 R Axis:   54  Text Interpretation: Sinus tachycardia Ventricular premature complex Borderline low voltage, extremity leads Borderline ST elevation, anterior leads  Borderline prolonged QT interval Baseline wander in lead(s) V3 Partial missing lead(s): V3 will repeat Confirmed by Elnor Jayson ([id number]) on 08/14/2023 9:11:08 AM         Imaging Studies ordered: I ordered imaging studies including CT head, CTA head and neck, MRI brain without I independently visualized the following imaging with scope of interpretation limited to determining acute life threatening conditions related to emergency care; findings noted above I independently visualized and interpreted imaging. I agree with the radiologist interpretation   Medicines ordered and prescription drug management: Meds ordered this encounter  Medications   sodium chloride  flush (NS) 0.9 % injection 3 mL   sodium chloride  0.9 % bolus 500 mL  iohexol  (OMNIPAQUE ) 350 MG/ML injection 75 mL    stroke: early stages of recovery book   OR Linked Order Group    acetaminophen  (TYLENOL ) tablet 650 mg    acetaminophen  (TYLENOL ) 160 MG/5ML solution 650 mg    acetaminophen  (TYLENOL ) suppository 650 mg   senna-docusate (Senokot-S) tablet 1 tablet   enoxaparin  (LOVENOX ) injection 40 mg   ondansetron  (ZOFRAN ) injection 4 mg   perflutren  lipid microspheres (DEFINITY ) IV suspension   OR Linked Order Group    aspirin  chewable tablet 81 mg    aspirin  suppository 300 mg   clopidogrel  (PLAVIX ) tablet 75 mg   atorvastatin  (LIPITOR) tablet 80 mg   ibuprofen  (ADVIL ) tablet 600 mg    -I have reviewed the patients home medicines and have made adjustments as needed   Consultations Obtained: I requested consultation with the neurology,  and discussed lab and imaging findings as well as pertinent plan - they recommend: admit   Cardiac Monitoring: The patient was maintained on a cardiac monitor.  I personally viewed and interpreted the cardiac monitored which showed an underlying rhythm of: NSR Continuous pulse oximetry interpreted by myself, 99% on RA.    Social Determinants of Health:  Diagnosis or treatment  significantly limited by social determinants of health: current smoker Counseled patient for approximately 3 minutes regarding smoking cessation. Discussed risks of smoking and how they applied and affected their visit here today. Patient not ready to quit at this time, however will follow up with their primary doctor when they are.   CPT code: 00593: intermediate counseling for smoking cessation     Reevaluation: After the interventions noted above, I reevaluated the patient and found that they have stayed the same  Co morbidities that complicate the patient evaluation  Past Medical History:  Diagnosis Date   Urinary tract infection       Dispostion: Disposition decision including need for hospitalization was considered, and patient admitted to the hospital.    Final Clinical Impression(s) / ED Diagnoses Final diagnoses:  Cerebrovascular accident (CVA), unspecified mechanism (HCC)        Elnor Jayson LABOR, DO 08/14/23 1804

## 2023-08-14 NOTE — ED Notes (Signed)
 Dr. Wallace Cullens at bedside evaluating patient

## 2023-08-14 NOTE — Consult Note (Addendum)
 NEUROLOGY CONSULT NOTE   Date of service: August 14, 2023 Patient Name: Michael Ritter MRN:  [id number] DOB:  1965-08-25 Chief Complaint: slurred speech and right side weakness Requesting Provider: Elnor Jayson LABOR, DO  History of Present Illness  Michael Ritter is a 58 y.o. male  no significant PMH. Presented to [facility] ED with complaints of slurred speech and right side weakness.   Patient reports that on 08/13/23 around 1900 he was laying back in a chair. He fell asleep, but then reports it was  like he could not wake up. He woke up agitated, felt that his speech was slurred. Family reports that when he tried to walk, he kept leaning and tripping to the right side. He stated that this slurred speech and weakness on the right side of his body began about 1900. When he woke up this morning it still was not gone and family made the patient come to the ED. Denies HA, blurred vision, room spinning sensation, N/V. Patient now endorses a HA at the base of the skull.  Patient does not have a PCP, we discussed the need to obtain PCP for management of things like (BP, Cholesterol and BG). Patient also endorses smoking 1 pack of cigarettes every 2 days. Patient not currently taking ASA, statins, blood thinners or any BP medications  LKW: 08/13/23 @ 1900 Modified rankin score: 0-Completely asymptomatic and back to baseline post- stroke IV Thrombolysis:  No outside of window EVT: No, LVO  ED course: CTH: no hemorrhage CTA H/N: no LVO MRI: acute left IC infarct CBG: 169 BP: 180/95  NIHSS components Score: Comment  1a Level of Conscious 0[x]  1[]  2[]  3[]      1b LOC Questions 0[x]  1[]  2[]       1c LOC Commands 0[x]  1[]  2[]       2 Best Gaze 0[x]  1[]  2[]       3 Visual 0[x]  1[]  2[]  3[]      4 Facial Palsy 0[x]  1[]  2[]  3[]      5a Motor Arm - left 0[x]  1[]  2[]  3[]  4[]  UN[]    5b Motor Arm - Right 0[x]  1[]  2[]  3[]  4[]  UN[]    6a Motor Leg - Left 0[x]  1[]  2[]  3[]  4[]  UN[]    6b Motor Leg - Right 0[x]  1[]  2[]  3[]   4[]  UN[]    7 Limb Ataxia 0[]  1[]  2[x]  3[]  UN[]     8 Sensory 0[x]  1[]  2[]  UN[]      9 Best Language 0[x]  1[]  2[]  3[]      10 Dysarthria 0[]  1[x]  2[]  UN[]      11 Extinct. and Inattention 0[x]  1[]  2[]       TOTAL:       ROS  Comprehensive ROS performed and pertinent positives documented in HPI provided from patient and chart review.   Past History   Past Medical History:  Diagnosis Date   Urinary tract infection     Past Surgical History:  Procedure Laterality Date   VASECTOMY  2002    Family History: Family History  Problem Relation Age of Onset   Hypertension Father     Social History  reports that he has been smoking. He does not have any smokeless tobacco history on file. He reports that he does not drink alcohol and does not use drugs.  Allergies  Allergen Reactions   Penicillins     Other reaction(s): Unknown    Medications  No current facility-administered medications for this encounter.  Current Outpatient Medications:    busPIRone  (BUSPAR ) 7.5 MG  tablet, Take 1 tablet (7.5 mg total) by mouth 2 (two) times daily., Disp: 60 tablet, Rfl: 1   ibuprofen  (ADVIL ,MOTRIN ) 200 MG tablet, Take 800 mg by mouth every 6 (six) hours as needed for moderate pain., Disp: , Rfl:    Multiple Vitamin (MULTIVITAMIN WITH MINERALS) TABS tablet, Take 1 tablet by mouth daily., Disp: , Rfl:   Vitals   Vitals:   2023/08/21 0856 August 21, 2023 1014 08-21-23 1244 08/21/2023 1311  BP: (!) 192/113 (!) 180/95 (!) 186/112   Pulse: (!) 102 94 93   Resp: 20 18 17    Temp: 98.4 F (36.9 C)   98.9 F (37.2 C)  TempSrc: Oral   Oral  SpO2: 97% 100% 96%     There is no height or weight on file to calculate BMI.  Physical Exam   Constitutional: Appears well-developed and well-nourished.  Psych: Affect appropriate to situation.  Eyes: No scleral injection.  HENT: No OP obstruction.  Head: Normocephalic.  Cardiovascular: Normal rate and regular rhythm.  Respiratory: Effort normal, non-labored  breathing.  GI: Soft.  No distension. There is no tenderness.  Skin: WDI.   Neurologic Examination   Physical Exam  Constitutional: Appears well-developed and well-nourished.  Psych: Affect appropriate to situation Eyes: Normal external eye and conjunctiva. HENT: Normocephalic, no lesions, without obvious abnormality.   Musculoskeletal-no joint tenderness, deformity or swelling Cardiovascular: Normal rate and regular rhythm.  Respiratory: Effort normal, non-labored breathing saturations WNL GI: Soft.  No distension. There is no tenderness.  Skin: WDI   Neuro:  Mental Status: Alert, oriented,  Speech fluent without evidence of aphasia.  Mild dysarthria present. Able to follow commands without difficulty. Naming intact.  Cranial Nerves: II: Visual fields grossly normal,  III,IV, VI: ptosis not present, extra-ocular motions intact bilaterally pupils equal, round, reactive to light and accommodation V,VII: smile symmetric, facial light touch sensation normal bilaterally VIII: hearing normal bilaterally IX,X: uvula rises symmetrically XI: bilateral shoulder shrug XII: midline tongue extension Motor: Right : Upper extremity   5/5  Left:     Upper extremity   5/5  Lower extremity   5/5   Lower extremity   5/5 Tone and bulk:normal tone throughout; no atrophy noted Sensory: light touch intact throughout, bilaterally Deep Tendon Reflexes: 2+ and symmetric biceps, patella Cerebellar:on FNF right side with dysmetria and normal on the left. normal heel-to-shin test on the left. Right HTS with some ataxia.  Gait: deferred     Labs/Imaging/Neurodiagnostic studies   CBC:  Recent Labs  Lab 21-Aug-2023 0925 August 21, 2023 0948  WBC 10.0  --   NEUTROABS 6.1  --   HGB 16.8 16.0  HCT 50.5 47.0  MCV 91.2  --   PLT 226  --    Basic Metabolic Panel:  Lab Results  Component Value Date   NA 141 August 21, 2023   K 4.0 Aug 21, 2023   CO2 24 2023-08-21   GLUCOSE 166 (H) 08/21/2023   BUN 14  08-21-23   CREATININE 1.00 August 21, 2023   CALCIUM  9.5 2023-08-21   GFRNONAA >60 08-21-2023   Lipid Panel:  Lab Results  Component Value Date   LDLCALC 141 (H) August 21, 2023   HgbA1c: No results found for: HGBA1C Urine Drug Screen: No results found for: LABOPIA, COCAINSCRNUR, LABBENZ, AMPHETMU, THCU, LABBARB  Alcohol Level     Component Value Date/Time   ETH <10 2023-08-21 0941   INR  Lab Results  Component Value Date   INR 0.9 21-Aug-2023   APTT  Lab Results  Component Value Date  APTT 24 08/14/2023    CT Head without contrast(Personally reviewed): No hemorrhage  CT angio Head and Neck with contrast(Personally reviewed): No LVO; flow reducing stenosis of major arteries in head and neck    MRI Brain(Personally reviewed): Left internal capsule infarct    ASSESSMENT   Michael Ritter is a 58 y.o. male  no significant PMH. Presented to [facility] ED with complaints of slurred speech and right side weakness. CTH was negative for hemorrhage. CTA was negative for LVO. MRI showed infarct in left internal capsule. Discussed the full stroke work-up with the patient.  RECOMMENDATIONS  Recommend --- BP goal : Permissive HTN upto 220/110 mmHg (for 24-48 post admission) goal is to normalize BP < 140/90 by discharge. -HgbA1c, fasting lipid panel --PT consult, OT consult, Speech consult --Echocardiogram -- start atorvastatin  80 mg --81mg  ASA along with plavix  75mg  daily x 21 days, followed by Aspirin  81mg  daily alone. --Telemetry monitoring --Frequent neuro checks --NPO until passes stroke swallow screen ______________________________________________________________________    Signed, Harlene LOISE Pouch, NP [facility]  NEUROHOSPITALIST ADDENDUM Performed a face to face diagnostic evaluation.   I have reviewed the contents of history and physical exam as documented by PA/ARNP/Resident and agree with above documentation.  I have discussed and formulated  the above plan as documented. Edits to the note have been made as needed.  Impression/Key exam findings/Plan: 56M p/w R sided weakness + slurred speech. Improved in the ED. LKW 1900. He was outside tnkase window, not a candidate for thrombectomy 2/2 no LVO. MRI shows a L BG/IC stroke.  Risk factors for stroke include: daily smoker, has not seen a doctor in several years.  Will need stroke workup, I suspect likely etiology is small vessel disease.  Counseled him on the importance of coming to the ED at the first sign of stroke and reducing/quitting smoking.  Michael Ashe, MD [facility] [phone]   If 7pm to 7am, please call on call as listed on AMION.

## 2023-08-14 NOTE — ED Triage Notes (Signed)
 PT arrives via POV with his wife. Pt reports around 1900 or so, he began experiencing slurred speech and weakness on the right side of his body. Pt is AxOx4. He does report a headache at this time.

## 2023-08-14 NOTE — H&P (Addendum)
 History and Physical  Michael Ritter FMW:[id number] DOB: Aug 22, 1965 DOA: 08/14/2023  PCP: Freddrick, No   Chief Complaint: Slurred speech  HPI: Michael Ritter is a 58 y.o. male with medical history significant for hypertension not taking any prescription medications being admitted to the hospital with acute stroke.  He began experiencing slurred speech and right-sided weakness at around 7 PM last night, went to bed with the symptoms, woke up with the same symptoms and then decided to come to the ER for evaluation.  Denies any recent illness, no falls, no injuries, not on any blood thinners.  Never had symptoms like this before.  Workup in the ER shows acute stroke as described below.  Patient has been seen in consultation by neurology.  Review of Systems: Please see HPI for pertinent positives and negatives. A complete 10 system review of systems are otherwise negative.  Past Medical History:  Diagnosis Date   Urinary tract infection    Past Surgical History:  Procedure Laterality Date   VASECTOMY  2002   Social History:  reports that he has been smoking. He does not have any smokeless tobacco history on file. He reports that he does not drink alcohol and does not use drugs.  Allergies  Allergen Reactions   Penicillins     Other reaction(s): Unknown    Family History  Problem Relation Age of Onset   Hypertension Father      Prior to Admission medications   Medication Sig Start Date End Date Taking? Authorizing Provider  busPIRone  (BUSPAR ) 7.5 MG tablet Take 1 tablet (7.5 mg total) by mouth 2 (two) times daily. Patient not taking: Reported on 08/14/2023 03/16/14   Viktoria Donnice POUR, PA-C  ibuprofen  (ADVIL ,MOTRIN ) 200 MG tablet Take 800 mg by mouth every 6 (six) hours as needed for moderate pain.   Yes [provider]  Multiple Vitamin (MULTIVITAMIN WITH MINERALS) TABS tablet Take 1 tablet by mouth daily.   Yes [provider]    Physical Exam: BP (!) 178/93   Pulse  81   Temp 98.9 F (37.2 C) (Oral)   Resp (!) 21   SpO2 97%  General:  Alert, oriented, calm, in no acute distress  Eyes: EOMI, clear conjuctivae, white sclerea Neck: supple, no masses, trachea mildline  Cardiovascular: RRR, no murmurs or rubs, no peripheral edema  Respiratory: clear to auscultation bilaterally, no wheezes, no crackles  Abdomen: soft, nontender, nondistended, normal bowel tones heard  Skin: dry, no rashes  Musculoskeletal: no joint effusions, normal range of motion  Psychiatric: appropriate affect, normal speech  Neurologic: extraocular muscles intact, clear speech, moving all extremities with intact sensorium         Labs on Admission:  Basic Metabolic Panel: Recent Labs  Lab 08/14/23 0925 08/14/23 0948  NA 135 141  K 3.8 4.0  CL 103 104  CO2 24  --   GLUCOSE 171* 166*  BUN 15 14  CREATININE 0.96 1.00  CALCIUM  9.5  --    Liver Function Tests: Recent Labs  Lab 08/14/23 0925  AST 30  ALT 35  ALKPHOS 68  BILITOT 0.7  PROT 8.2*  ALBUMIN 4.4   No results for input(s): LIPASE, AMYLASE in the last 168 hours. No results for input(s): AMMONIA in the last 168 hours. CBC: Recent Labs  Lab 08/14/23 0925 08/14/23 0948  WBC 10.0  --   NEUTROABS 6.1  --   HGB 16.8 16.0  HCT 50.5 47.0  MCV 91.2  --  PLT 226  --    Cardiac Enzymes: No results for input(s): CKTOTAL, CKMB, CKMBINDEX, TROPONINI in the last 168 hours. BNP (last 3 results) No results for input(s): BNP in the last 8760 hours.  ProBNP (last 3 results) No results for input(s): PROBNP in the last 8760 hours.  CBG: Recent Labs  Lab 08/14/23 0850  GLUCAP 169*    Radiological Exams on Admission: MR BRAIN WO CONTRAST Result Date: 08/14/2023 CLINICAL DATA:  Neuro deficit, acute, stroke suspected. Slurred speech and right-sided weakness. Headache. EXAM: MRI HEAD WITHOUT CONTRAST TECHNIQUE: Multiplanar, multiecho pulse sequences of the brain and surrounding structures were  obtained without intravenous contrast. COMPARISON:  Head CT and CTA 08/14/2023 FINDINGS: Brain: There is a 1.5 cm acute infarct involving the posterior limb of the left internal capsule, corresponding to the asymmetric hypodensity described on today's CT. Scattered punctate T2 hyperintensities elsewhere in the cerebral white matter bilaterally are nonspecific but compatible with minimal chronic small vessel ischemic disease. Cerebral volume is within normal limits for age with normal size of the ventricles. No intracranial hemorrhage, mass, midline shift, or extra-axial fluid collection is identified. Vascular: Major intracranial vascular flow voids are preserved. Skull and upper cervical spine: Unremarkable bone marrow signal. Sinuses/Orbits: Unremarkable orbits. Moderate mucosal thickening in the paranasal sinuses. Clear mastoid air cells. Other: None. IMPRESSION: Acute left internal capsule infarct. Electronically Signed   By: Dasie Hamburg M.D.   On: 08/14/2023 13:17   CT ANGIO HEAD NECK W WO CM Result Date: 08/14/2023 CLINICAL DATA:  Neuro deficit with acute stroke suspected EXAM: CT ANGIOGRAPHY HEAD AND NECK WITH AND WITHOUT CONTRAST TECHNIQUE: Multidetector CT imaging of the head and neck was performed using the standard protocol during bolus administration of intravenous contrast. Multiplanar CT image reconstructions and MIPs were obtained to evaluate the vascular anatomy. Carotid stenosis measurements (when applicable) are obtained utilizing NASCET criteria, using the distal internal carotid diameter as the denominator. RADIATION DOSE REDUCTION: This exam was performed according to the departmental dose-optimization program which includes automated exposure control, adjustment of the mA and/or kV according to patient size and/or use of iterative reconstruction technique. CONTRAST:  75mL OMNIPAQUE  IOHEXOL  350 MG/ML SOLN COMPARISON:  Head CT from earlier today. FINDINGS: CTA NECK FINDINGS Aortic arch:  Unremarkable with 3 vessel branching Right carotid system: Atheromatous wall thickening of the common internal carotid arteries. Mixed density plaque accentuated at the bifurcation without significant stenosis and no ulceration. No beading or dissection. Left carotid system: Atheromatous wall thickening of the common carotid with accentuated mixed density plaque at the bifurcation. No stenosis or ulceration. Vertebral arteries: The vertebral arteries are smoothly contoured and diffusely patent. No proximal subclavian stenosis. Skeleton: Unremarkable Other neck: Patchy bilateral paranasal sinus opacification, incidental to the history. Upper chest: No acute finding Review of the MIP images confirms the above findings CTA HEAD FINDINGS Anterior circulation: No significant stenosis, proximal occlusion, aneurysm, or vascular malformation. Generalized atheromatous type irregularity of medium size branches. Posterior circulation: Mild atheromatous irregularity of the posterior cerebral artery especially on the right where there is a mild-to-moderate P3 stenosis. Venous sinuses: Unremarkable Anatomic variants: None significant Review of the MIP images confirms the above findings IMPRESSION: 1. No emergent finding. 2. Cervical and intracranial atherosclerosis without flow reducing stenosis or ulceration of major arteries in the head and neck. Electronically Signed   By: Dorn Roulette M.D.   On: 08/14/2023 11:28   CT HEAD WO CONTRAST Result Date: 08/14/2023 CLINICAL DATA:  Neuro deficit, acute, stroke suspected. Slurred  speech and right-sided weakness. EXAM: CT HEAD WITHOUT CONTRAST TECHNIQUE: Contiguous axial images were obtained from the base of the skull through the vertex without intravenous contrast. RADIATION DOSE REDUCTION: This exam was performed according to the departmental dose-optimization program which includes automated exposure control, adjustment of the mA and/or kV according to patient size and/or use of  iterative reconstruction technique. COMPARISON:  None Available. FINDINGS: Brain: No acute hemorrhage. Possible asymmetric hypoattenuation in the posterior limb of the left internal capsule (axial image 18 series 2). Cortical gray-white differentiation is preserved. No hydrocephalus or extra-axial collection. No mass effect or midline shift. Vascular: No hyperdense vessel or unexpected calcification. Skull: No calvarial fracture or suspicious bone lesion. Skull base is unremarkable. Sinuses/Orbits: Mild pansinus disease.  Orbits are unremarkable. Other: None. IMPRESSION: 1. Possible asymmetric hypoattenuation in the posterior limb of the left internal capsule, which could represent an age-indeterminate infarct. Consider MRI for further evaluation. 2. No acute hemorrhage. Electronically Signed   By: Ryan Chess M.D.   On: 08/14/2023 09:23   Assessment/Plan 58 year old gentleman with a past medical history of hypertension and depression as well as tobacco abuse being admitted to the hospital with acute left internal capsule infarction.  Acute CVA-left internal capsule, he is not antithrombotic candidate due to outside of time window. -Inpatient admission -Continuous telemetry -Check hemoglobin A1c, and fasting lipid panel -Permissive hypertension per neurology -Check 2D echo -N.p.o. until RN bedside swallow screen -PT/OT/SLP if indicated -Start ASA and Plavix   Tobacco abuse-patient counseled that he needs to quit  DVT prophylaxis: Lovenox      Code Status: Full Code  Consults called: Inpatient neurology  Admission status: The appropriate patient status for this patient is INPATIENT. Inpatient status is judged to be reasonable and necessary in order to provide the required intensity of service to ensure the patient's safety. The patient's presenting symptoms, physical exam findings, and initial radiographic and laboratory data in the context of their chronic comorbidities is felt to place them  at high risk for further clinical deterioration. Furthermore, it is not anticipated that the patient will be medically stable for discharge from the hospital within 2 midnights of admission.    I certify that at the point of admission it is my clinical judgment that the patient will require inpatient hospital care spanning beyond 2 midnights from the point of admission due to high intensity of service, high risk for further deterioration and high frequency of surveillance required  Time spent: 59 minutes  Epiphany Seltzer CHRISTELLA Gail MD [facility] Pager [phone]  If 7PM-7AM, please contact night-coverage [email or web address] Password [facility]  08/14/2023, 2:32 PM

## 2023-08-14 NOTE — ED Notes (Signed)
 Patient denies pain and is resting comfortably.

## 2023-08-14 NOTE — ED Notes (Signed)
 No code stroke activation per Dr. Wallace Cullens

## 2023-08-14 NOTE — ED Notes (Signed)
CBG 169. 

## 2023-08-14 NOTE — ED Notes (Signed)
 Placed pt on 2 L/M Nasal cannula due to pt's SpO2 maintained around 86% RA with sleep, pt now displaying 96% SpO2.

## 2023-08-15 ENCOUNTER — Encounter (HOSPITAL_COMMUNITY): Payer: Self-pay | Admitting: Internal Medicine

## 2023-08-15 ENCOUNTER — Other Ambulatory Visit: Payer: Self-pay | Admitting: Physician Assistant

## 2023-08-15 ENCOUNTER — Other Ambulatory Visit (HOSPITAL_COMMUNITY): Payer: Self-pay

## 2023-08-15 DIAGNOSIS — F17210 Nicotine dependence, cigarettes, uncomplicated: Secondary | ICD-10-CM

## 2023-08-15 DIAGNOSIS — R0902 Hypoxemia: Secondary | ICD-10-CM

## 2023-08-15 DIAGNOSIS — R030 Elevated blood-pressure reading, without diagnosis of hypertension: Secondary | ICD-10-CM | POA: Diagnosis not present

## 2023-08-15 DIAGNOSIS — I639 Cerebral infarction, unspecified: Secondary | ICD-10-CM | POA: Diagnosis not present

## 2023-08-15 DIAGNOSIS — E119 Type 2 diabetes mellitus without complications: Secondary | ICD-10-CM | POA: Diagnosis not present

## 2023-08-15 HISTORY — DX: Type 2 diabetes mellitus without complications: E11.9

## 2023-08-15 HISTORY — DX: Elevated blood-pressure reading, without diagnosis of hypertension: R03.0

## 2023-08-15 HISTORY — DX: Hypoxemia: R09.02

## 2023-08-15 HISTORY — DX: Nicotine dependence, cigarettes, uncomplicated: F17.210

## 2023-08-15 LAB — HIV ANTIBODY (ROUTINE TESTING W REFLEX): HIV Screen 4th Generation wRfx: NONREACTIVE

## 2023-08-15 LAB — HEMOGLOBIN A1C
Hgb A1c MFr Bld: 8 % — ABNORMAL HIGH (ref 4.8–5.6)
Mean Plasma Glucose: 183 mg/dL

## 2023-08-15 MED ORDER — ATORVASTATIN CALCIUM 80 MG PO TABS
80.0000 mg | ORAL_TABLET | Freq: Every day | ORAL | 0 refills | Status: DC
  Filled 2023-08-15: qty 30, 30d supply, fill #0

## 2023-08-15 MED ORDER — ASPIRIN 81 MG PO CHEW: 81.0000 mg | CHEWABLE_TABLET | Freq: Every day | ORAL | Status: DC

## 2023-08-15 MED ORDER — CLOPIDOGREL BISULFATE 75 MG PO TABS
75.0000 mg | ORAL_TABLET | Freq: Every day | ORAL | 0 refills | Status: DC
  Filled 2023-08-15: qty 21, 21d supply, fill #0

## 2023-08-15 NOTE — Progress Notes (Signed)
 Brief Neuro Note:  Stroke workup with LDL elevated to 141. HbA1c of 8.0 suggestive of diabetes. I still suspect that the likely etiology of the noted stroke is small vessel disease but the appearance is somewhat odd and I do think that the size of the stroke being larger than 1cm and no other noted small vessel disease on T2/FLAIR imaging on MRI is less suggestive of this being small vessel. 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  Recs: - PT and OT evaluation is pending. - Aspirin  81mg  daily along with plavix  75mg  daily x 21 days, followed by Aspirin  81mg  daily alone. - Atorvastatin  80mg  daily for LDL > 70 - Establish with PCP for outpatient Diabetes management. - Establish with PCP, he is contemplating quitting smoking and I had a detailed discussion with his about nicotine patches, gum, chantix and wellbutrin  as potential options to aid in quitting or he can try to do it himself and reach out to us  if he is struggling to quit. - outpatient TEE - outpatient 30 day cardiac monitoring. - follow up with stroke clinic with [facility] in 2-3 months. - we will signoff. Please feel free to contact us  with any questions or concerns.  Plan discussed with Dr. Jadine over secure chat.  Edelyn Heidel [facility]

## 2023-08-15 NOTE — Care Management (Addendum)
 Transition of Care [facility]) - Emergency Department Mini Assessment   Patient Details  Name: Michael Ritter MRN: [id number] Date of Birth: 1966-03-20  Transition of Care [facility]) CM/SW Contact:    Michael JONELLE Cory, RN Phone Number: 08/15/2023, 1:56 PM   Clinical Narrative: Received secure chat regarding MD speaking with patient about discharge. This RNCM spoke with patient and significant other Michael Ritter [phone] at bedside with MD present. Patient and Michael Ritter are requesting PCP appointment with an MD near the [place] area [place] This RNCM explained the PCP needs to follow in the community to facilitate specialty care. Per chart review patient currently at [facility] ED for acute CVA workup. Patient is potential for code 15 if discharged from the ED. This RNCM will assist with coordinating PCP and outpatient PT,OT, SL therapy. Patient prefers calling Michael Ritter first and him second.   Transportation at discharge: Michael Ritter   -2:10pm This RNCM scheduled the first available PCP appointment with [facility]/ Dr. Jesus on Tuesday 08/19/23 at 9:45am. Outpatient PT/OT and SL referral sent to [facility].  This RNCM spoke with Michael Ritter to provide follow contact information, attached to AVS.     TOC will continue to follow.    ED Mini Assessment: What brought you to the Emergency Department? : experiencing slurred speech and weakness on the right side of his body.  Barriers to Discharge: No Barriers Identified  Barrier interventions: assist with coordinating PCP and outpatient specialist appt  Means of departure: Car  Interventions which prevented an admission or readmission: PCP counseling, Follow-up medical appointment    Patient Contact and Communications     Spoke with: Michael Ritter Contact Date: 08/15/23,   Contact time: 1356        CMS Medicare.gov Compare Post Acute Care list provided to:: Other (Comment Required)  (n/a) Choice offered to / list presented to : NA  Admission diagnosis:  Acute CVA (cerebrovascular accident) [facility]) [I63.9] Patient Active Problem List   Diagnosis Date Noted   Acute CVA (cerebrovascular accident) (HCC) 08/14/2023   Thyroid nodule 03/16/2014   PCP:  Pcp, No Pharmacy:   Michael Ritter - [facility] [address] [place] [address] Phone: [phone] Fax: [phone]

## 2023-08-15 NOTE — Evaluation (Signed)
 Physical Therapy Evaluation Patient Details Name: Michael Ritter MRN: [id number] DOB: 01-20-1966 Today's Date: 08/15/2023  History of Present Illness  pt 58 y/o in ED with complains of Right sided weakness, bumping into things on right and slurred speech for past 48hrs. Pt reports he is a smoker and has had increased chest congestiona nd coughing in last few weeks. He has been coughing hard and feels a lot of pressure in his head and headache. 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  Clinical Impression  Pt in ED with acute CVA as noted above presents with no notable LE deficits at this time, however does exhibit some R UE , and speech deficits. OT to assess this morning as well. Educated wife and patient a lot with safety measures, what to be aware of, as well as challenging that R UE and ambulation, but to also take rest breaks. Tested O2 during session and remained at 94% on RA however during session did have several coughes with wet congestion sounds. MD aware. Pt and wife had a lot of questions about nutrition and preventative questions I was able to address a little but did encourage to ask MD when he arrived as well.  Recommend OP OT and OP SLP . Mobility Specialist to follow if remains in the hospital at this time.       If plan is discharge home, recommend the following: A little help with walking and/or transfers;A little help with bathing/dressing/bathroom;Assist for transportation;Help with stairs or ramp for entrance   Can travel by private vehicle        Equipment Recommendations None recommended by PT  Recommendations for Other Services       Functional Status Assessment Patient has had a recent decline in their functional status and demonstrates the ability to make significant improvements in function in a reasonable and predictable amount of time.     Precautions / Restrictions Precautions Precautions: Fall (risk just due to stroke a this time, no history of  falls) Restrictions Weight Bearing Restrictions Per Provider Order: No      Mobility  Bed Mobility Overal bed mobility: Modified Independent                  Transfers Overall transfer level: Modified independent Equipment used: 1 person hand held assist               General transfer comment: then ambulated with no hand held assist in room and hallway with no loss of balance and no apparent right sided neglect today at this time    Ambulation/Gait Ambulation/Gait assistance: Contact guard assist Gait Distance (Feet): 50 Feet Assistive device: 1 person hand held assist (then no AD) Gait Pattern/deviations: Step-through pattern       General Gait Details: no deficits noted with basic hallway walking and room ealking negotiating objects.  Stairs            Wheelchair Mobility     Tilt Bed    Modified Rankin (Stroke Patients Only)       Balance Overall balance assessment: No apparent balance deficits (not formally assessed) (stood with perturbations and marching in place with no LOB today)                                           Pertinent Vitals/Pain Pain Assessment Pain Assessment: No/denies pain  Home Living Family/patient expects to be discharged to:: Private residence Living Arrangements: Spouse/significant other Available Help at Discharge: Family Type of Home: House Home Access: Stairs to enter Entrance Stairs-Rails: None (educated on how the wife will assist him up those 2 steps with hand held assit and gait belt given) Entrance Stairs-Number of Steps: 2   Home Layout: One level Home Equipment: None      Prior Function Prior Level of Function : Independent/Modified Independent;Working/employed;Driving                     Extremity/Trunk Assessment   Upper Extremity Assessment Upper Extremity Assessment: Defer to OT evaluation    Lower Extremity Assessment Lower Extremity Assessment: Overall WFL  for tasks assessed (tested coordinationa nd speed of LLE versus RLE , no notable differrence bewtween the 2 today and strength R=L, no deficits noted in static sitting tests. While waliing pt reported R LE felt tired , but no noticable differrence mechanically during gait)    Cervical / Trunk Assessment Cervical / Trunk Assessment: Normal  Communication   Communication Communication: Difficulty communicating thoughts/reduced clarity of speech (slow , slurred speech, but able to understand. Occassional stuggle with word finding. wife reports this has improved over the last 24 hr, yesterday she could not understand him.)  Cognition Arousal: Alert (reported slightly sleepy during eval and not normally like this) Behavior During Therapy: WFL for tasks assessed/performed Overall Cognitive Status: Within Functional Limits for tasks assessed                                          General Comments      Exercises     Assessment/Plan    PT Assessment Patient does not need any further PT services (recommend OP OT and SLP)  PT Problem List         PT Treatment Interventions      PT Goals (Current goals can be found in the Care Plan section)  Acute Rehab PT Goals Patient Stated Goal: I want to get better  ( pt was vey tearful that has happened to him) PT Goal Formulation: All assessment and education complete, DC therapy    Frequency       Co-evaluation               AM-PAC PT 6 Clicks Mobility  Outcome Measure Help needed turning from your back to your side while in a flat bed without using bedrails?: None Help needed moving from lying on your back to sitting on the side of a flat bed without using bedrails?: A Little Help needed moving to and from a bed to a chair (including a wheelchair)?: A Little Help needed standing up from a chair using your arms (e.g., wheelchair or bedside chair)?: A Little Help needed to walk in hospital room?: A Little Help  needed climbing 3-5 steps with a railing? : A Little 6 Click Score: 19    End of Session Equipment Utilized During Treatment: Gait belt Activity Tolerance: Patient tolerated treatment well Patient left: in chair;with family/visitor present Nurse Communication: Mobility status PT Visit Diagnosis: Other abnormalities of gait and mobility (R26.89)    Time: 1045-1130 PT Time Calculation (min) (ACUTE ONLY): 45 min   Charges:   PT Evaluation $PT Eval Low Complexity: 1 Low PT Treatments $Neuromuscular Re-education: 8-22 mins PT General Charges $$ ACUTE PT VISIT: 1  Visit         Cloretta, PT, MPT Acute Rehabilitation Services Office: [phone] If a weekend: secure chat groups: [facility] PT, [facility] OT, [facility] SLP 08/15/2023   Rosi Secrist, [facility] 08/15/2023, 2:33 PM

## 2023-08-15 NOTE — Progress Notes (Signed)
 30 day monitor for stroke - Dr. Tereso Newcomer to reach

## 2023-08-15 NOTE — Progress Notes (Signed)
 OT Note Pt seen for eval. Recommend follow up with OT at the [facility] - CM made aware. Full note to follow.  Luisa Dago, OT/L   Acute OT Clinical Specialist Acute Rehabilitation Services Pager [phone] Office [phone]

## 2023-08-15 NOTE — Hospital Course (Addendum)
 58 year old man without significant PMH who presented with slurred speech and right-sided weakness outside of tPA window.  CTA negative for LVO, CT head negative for hemorrhage.  MRI showed left internal capsule infarct.  Admitted for further evaluation per neurology to [facility].  Consultants Neurology   Procedures/Events

## 2023-08-15 NOTE — Care Management (Addendum)
 This RNCM spoke with Kreg with OT who reports the recommendation for outpatient OT with [facility]. Per chart review patient being admitted inpatient due to acute stroke. Patient does not have PCP on file will need to establish PCP care to follow with specialty providers in the community.     TOC will continue to follow.

## 2023-08-15 NOTE — Discharge Summary (Addendum)
 Physician Discharge Summary   Patient: Michael Ritter MRN: [id number] DOB: [birth year]  Admit date:     08/14/2023  Discharge date: 08/15/23  Discharge Physician: Toribio Door   PCP: Pcp, No   Recommendations at discharge:   Stroke, see below. Obesity.  Recommend weight loss.  Recommend low carbohydrate diet. New diagnosis diabetes mellitus type 2, currently advised on lifestyle changes.  Consider pharmacotherapy.  Will need further instruction following blood sugars. Elevated blood pressure.  Further evaluation to rule in or rule out hypertension. Recommend outpatient sleep study.  Discharge Diagnoses: Principal Problem:   Acute CVA (cerebrovascular accident) (HCC) Active Problems:   Cigarette smoker   Hypoxia   Elevated blood pressure reading   DM type 2 (diabetes mellitus, type 2) (HCC)  Resolved Problems:   * No resolved hospital problems. *  Hospital Course: 58 year old man without significant PMH who presented with slurred speech and right-sided weakness outside of tPA window.  CTA negative for LVO, CT head negative for hemorrhage.  MRI showed left internal capsule infarct.  Admitted for further evaluation per neurology to [facility].  Seen by neurology with recommendations as below and cleared for discharge home.  Seen by therapy PT and OT with recommendation for outpatient follow-up as well as outpatient speech therapy evaluation.  Other outpatient follow-up as below.  Consultants Neurology   Procedures/Events None  Acute CVA with slurred speech and right-sided weakness prior to admission Left internal capsule, he is not antithrombotic candidate due to outside of time window.  CT head no hemorrhage.  CTA no LVO.  MRI confirmed acute left internal capsule infarction. LVEF 55%.  Normal function.  No regional wall motion abnormalities.  Grade 1 diastolic dysfunction.  Bubble study negative probably.  Images were poor. Aspirin  and Plavix  daily for 3 weeks followed by  aspirin  81 mg daily alone Atorvastatin  80 mg daily.  LDL 141. Establish with PCP Stop smoking Outpatient TEE, 30-day cardiac monitor Follow-up with stroke clinic [facility] 2 to 3 months   Cigarette smoker Recommend cessation   Hypoxia without respiratory distress.   Noted to be hypoxic with sleep of 86%. Check SpO2 on room air at rest and with ambulation. Outpatient sleep study recommended.   Elevated blood pressure. Permissive hypertension.  Close outpatient follow-up with PCP to rule in or rule out hypertension.   Hyperglycemia.  New diagnosis diabetes mellitus type 2 based on hemoglobin A1c 8.0. Discussed in detail with patient.  For now recommend lifestyle changes, diet changes, weight loss.  Close follow-up with PCP and may need to initiate pharmacotherapy.  Also briefly discussed checking blood sugars.    Disposition: Home Diet recommendation:  Cardiac and Carb modified diet DISCHARGE MEDICATION: Allergies as of 08/15/2023       Reactions   Penicillins    Other reaction(s): Unknown        Medication List     STOP taking these medications    busPIRone  7.5 MG tablet Commonly known as: BUSPAR        TAKE these medications    aspirin  81 MG chewable tablet Chew 1 tablet (81 mg total) by mouth daily. Start taking on: August 16, 2023   atorvastatin  80 MG tablet Commonly known as: LIPITOR Take 1 tablet (80 mg total) by mouth daily. Start taking on: August 16, 2023   clopidogrel  75 MG tablet Commonly known as: PLAVIX  Take 1 tablet (75 mg total) by mouth daily. Start taking on: August 16, 2023   ibuprofen  200 MG tablet Commonly  known as: ADVIL  Take 800 mg by mouth every 6 (six) hours as needed for moderate pain.   multivitamin with minerals Tabs tablet Take 1 tablet by mouth daily.        Follow-up Information     [facility]. Go on 08/19/2023.   Specialty: Family Medicine Why: Go to this appointment on  Tuesday 08/19/23 at 9:45am to establish primary care services. Take your insurance card, any medication list. This appointmwnt is scheduled with Dr. Jesus.  If you need to reschedule please call the listed phone number. Contact information: [address] [place]  [phone] [phone] Additional information: [address]  [place], [place] [address]        [facility] Follow up.   Specialty: Rehabilitation Why: A referral has been sent to [facility] for outpatient physical, occupational and speech language therapies. You can follow up with them to confirm appointment availability. Contact information: [address] [place]  [address] [phone]               Feels better  Discharge Exam: There were no vitals filed for this visit. Physical Exam Vitals reviewed.  Constitutional:      General: He is not in acute distress.    Appearance: He is not ill-appearing or toxic-appearing.  Cardiovascular:     Rate and Rhythm: Normal rate and regular rhythm.     Heart sounds: No murmur heard. Pulmonary:     Effort: Pulmonary effort is normal. No respiratory distress.     Breath sounds: No wheezing, rhonchi or rales.  Musculoskeletal:     Right lower leg: No edema.     Left lower leg: No edema.     Comments: Strength in arms and legs appears grossly unremarkable.  Neurological:     Mental Status: He is alert.     Comments: Cranial nerves appear grossly intact.  Speech is mildly dysarthric.  No past-pointing noted.  Psychiatric:        Mood and Affect: Mood normal.        Behavior: Behavior normal.      Condition at discharge: good  The results of significant diagnostics from this hospitalization (including imaging, microbiology, ancillary and laboratory) are listed below for reference.   Imaging Studies: ECHOCARDIOGRAM COMPLETE Result Date: 08/14/2023     ECHOCARDIOGRAM REPORT   Patient Name:   Michael Ritter Date of Exam: 08/14/2023 Medical Rec #:  [id number]       Height:       75.0 in Accession #:    [id number]      Weight:       262.0 lb Date of Birth:  10-11-1965      BSA:          2.461 m Patient Age:    58 years        BP:           180/95 mmHg Patient Gender: M               HR:           78 bpm. Exam Location:  Inpatient Procedure: 2D Echo, Cardiac Doppler, Color Doppler, Saline Contrast Bubble Study            and Intracardiac Opacification Agent Indications:    CVA  History:        Patient has no prior history of Echocardiogram  examinations.                 Risk Factors:Current Smoker.  Sonographer:    Lanell Maduro Referring Phys: [id number] MIR M IKRAMULLAH IMPRESSIONS  1. Left ventricular ejection fraction, by estimation, is 55%. The left ventricle has normal function. The left ventricle has no regional wall motion abnormalities. There is moderate concentric left ventricular hypertrophy. Left ventricular diastolic parameters are consistent with Grade I diastolic dysfunction (impaired relaxation).  2. The IVC was not visualized. Right ventricular systolic function is normal. The right ventricular size is normal. Tricuspid regurgitation signal is inadequate for assessing PA pressure.  3. The mitral valve is normal in structure. No evidence of mitral valve regurgitation. No evidence of mitral stenosis.  4. The aortic valve is tricuspid. Aortic valve regurgitation is not visualized. No aortic stenosis is present.  5. Aortic dilatation noted. There is mild dilatation of the ascending aorta, measuring 38 mm.  6. Bubble study was probably negative but images were poor.  7. Technically difficult study with poor acoustic windows. FINDINGS  Left Ventricle: Left ventricular ejection fraction, by estimation, is 55%. The left ventricle has normal function. The left ventricle has no regional wall motion abnormalities. Definity  contrast agent was given IV to delineate  the left ventricular endocardial borders. The left ventricular internal cavity size was normal in size. There is moderate concentric left ventricular hypertrophy. Left ventricular diastolic parameters are consistent with Grade I diastolic dysfunction (impaired relaxation). Right Ventricle: The IVC was not visualized. The right ventricular size is normal. Right vetricular wall thickness was not well visualized. Right ventricular systolic function is normal. Tricuspid regurgitation signal is inadequate for assessing PA pressure. Left Atrium: Left atrial size was normal in size. Right Atrium: Right atrial size was normal in size. Pericardium: There is no evidence of pericardial effusion. Mitral Valve: The mitral valve is normal in structure. No evidence of mitral valve regurgitation. No evidence of mitral valve stenosis. Tricuspid Valve: The tricuspid valve is normal in structure. Tricuspid valve regurgitation is trivial. Aortic Valve: The aortic valve is tricuspid. Aortic valve regurgitation is not visualized. No aortic stenosis is present. Pulmonic Valve: The pulmonic valve was normal in structure. Pulmonic valve regurgitation is not visualized. Aorta: The aortic root is normal in size and structure and aortic dilatation noted. There is mild dilatation of the ascending aorta, measuring 38 mm. Venous: The inferior vena cava was not well visualized. IAS/Shunts: Bubble study was probably negative but images were poor. Agitated saline contrast was given intravenously to evaluate for intracardiac shunting.  LEFT VENTRICLE PLAX 2D LVIDd:         4.40 cm      Diastology LVIDs:         3.50 cm      LV e' medial:    5.87 cm/s LV PW:         1.50 cm      LV E/e' medial:  8.9 LV IVS:        1.60 cm      LV e' lateral:   5.11 cm/s LVOT diam:     2.50 cm      LV E/e' lateral: 10.3 LV SV:         97 LV SV Index:   39 LVOT Area:     4.91 cm  LV Volumes (MOD) LV vol d, MOD A2C: 125.0 ml LV vol d, MOD A4C: 144.0 ml LV vol s, MOD  A2C: 58.6 ml LV vol s, MOD  A4C: 56.1 ml LV SV MOD A2C:     66.4 ml LV SV MOD A4C:     144.0 ml LV SV MOD BP:      78.6 ml RIGHT VENTRICLE RV Basal diam:  3.40 cm RV S prime:     11.40 cm/s TAPSE (M-mode): 2.9 cm LEFT ATRIUM             Index        RIGHT ATRIUM           Index LA diam:        4.00 cm 1.63 cm/m   RA Area:     11.60 cm LA Vol (A2C):   44.1 ml 17.92 ml/m  RA Volume:   22.10 ml  8.98 ml/m LA Vol (A4C):   28.1 ml 11.42 ml/m LA Biplane Vol: 36.0 ml 14.63 ml/m  AORTIC VALVE LVOT Vmax:   106.00 cm/s LVOT Vmean:  72.300 cm/s LVOT VTI:    0.198 m  AORTA Ao Root diam: 3.40 cm Ao Asc diam:  3.80 cm MITRAL VALVE MV Area (PHT): 4.68 cm    SHUNTS MV Decel Time: 162 msec    Systemic VTI:  0.20 m MV E velocity: 52.40 cm/s  Systemic Diam: 2.50 cm MV A velocity: 99.00 cm/s MV E/A ratio:  0.53 Dalton McleanMD Electronically signed by Ezra Kanner Signature Date/Time: 08/14/2023/5:38:42 PM    Final    MR BRAIN WO CONTRAST Result Date: 08/14/2023 CLINICAL DATA:  Neuro deficit, acute, stroke suspected. Slurred speech and right-sided weakness. Headache. EXAM: MRI HEAD WITHOUT CONTRAST TECHNIQUE: Multiplanar, multiecho pulse sequences of the brain and surrounding structures were obtained without intravenous contrast. COMPARISON:  Head CT and CTA 08/14/2023 FINDINGS: Brain: There is a 1.5 cm acute infarct involving the posterior limb of the left internal capsule, corresponding to the asymmetric hypodensity described on today's CT. Scattered punctate T2 hyperintensities elsewhere in the cerebral white matter bilaterally are nonspecific but compatible with minimal chronic small vessel ischemic disease. Cerebral volume is within normal limits for age with normal size of the ventricles. No intracranial hemorrhage, mass, midline shift, or extra-axial fluid collection is identified. Vascular: Major intracranial vascular flow voids are preserved. Skull and upper cervical spine: Unremarkable bone marrow signal.  Sinuses/Orbits: Unremarkable orbits. Moderate mucosal thickening in the paranasal sinuses. Clear mastoid air cells. Other: None. IMPRESSION: Acute left internal capsule infarct. Electronically Signed   By: Dasie Hamburg M.D.   On: 08/14/2023 13:17   CT ANGIO HEAD NECK W WO CM Result Date: 08/14/2023 CLINICAL DATA:  Neuro deficit with acute stroke suspected EXAM: CT ANGIOGRAPHY HEAD AND NECK WITH AND WITHOUT CONTRAST TECHNIQUE: Multidetector CT imaging of the head and neck was performed using the standard protocol during bolus administration of intravenous contrast. Multiplanar CT image reconstructions and MIPs were obtained to evaluate the vascular anatomy. Carotid stenosis measurements (when applicable) are obtained utilizing NASCET criteria, using the distal internal carotid diameter as the denominator. RADIATION DOSE REDUCTION: This exam was performed according to the departmental dose-optimization program which includes automated exposure control, adjustment of the mA and/or kV according to patient size and/or use of iterative reconstruction technique. CONTRAST:  75mL OMNIPAQUE  IOHEXOL  350 MG/ML SOLN COMPARISON:  Head CT from earlier today. FINDINGS: CTA NECK FINDINGS Aortic arch: Unremarkable with 3 vessel branching Right carotid system: Atheromatous wall thickening of the common internal carotid arteries. Mixed density plaque accentuated at the bifurcation without significant stenosis and no ulceration. No beading or dissection. Left carotid system: Atheromatous wall  thickening of the common carotid with accentuated mixed density plaque at the bifurcation. No stenosis or ulceration. Vertebral arteries: The vertebral arteries are smoothly contoured and diffusely patent. No proximal subclavian stenosis. Skeleton: Unremarkable Other neck: Patchy bilateral paranasal sinus opacification, incidental to the history. Upper chest: No acute finding Review of the MIP images confirms the above findings CTA HEAD FINDINGS  Anterior circulation: No significant stenosis, proximal occlusion, aneurysm, or vascular malformation. Generalized atheromatous type irregularity of medium size branches. Posterior circulation: Mild atheromatous irregularity of the posterior cerebral artery especially on the right where there is a mild-to-moderate P3 stenosis. Venous sinuses: Unremarkable Anatomic variants: None significant Review of the MIP images confirms the above findings IMPRESSION: 1. No emergent finding. 2. Cervical and intracranial atherosclerosis without flow reducing stenosis or ulceration of major arteries in the head and neck. Electronically Signed   By: Dorn Roulette M.D.   On: 08/14/2023 11:28   CT HEAD WO CONTRAST Result Date: 08/14/2023 CLINICAL DATA:  Neuro deficit, acute, stroke suspected. Slurred speech and right-sided weakness. EXAM: CT HEAD WITHOUT CONTRAST TECHNIQUE: Contiguous axial images were obtained from the base of the skull through the vertex without intravenous contrast. RADIATION DOSE REDUCTION: This exam was performed according to the departmental dose-optimization program which includes automated exposure control, adjustment of the mA and/or kV according to patient size and/or use of iterative reconstruction technique. COMPARISON:  None Available. FINDINGS: Brain: No acute hemorrhage. Possible asymmetric hypoattenuation in the posterior limb of the left internal capsule (axial image 18 series 2). Cortical gray-white differentiation is preserved. No hydrocephalus or extra-axial collection. No mass effect or midline shift. Vascular: No hyperdense vessel or unexpected calcification. Skull: No calvarial fracture or suspicious bone lesion. Skull base is unremarkable. Sinuses/Orbits: Mild pansinus disease.  Orbits are unremarkable. Other: None. IMPRESSION: 1. Possible asymmetric hypoattenuation in the posterior limb of the left internal capsule, which could represent an age-indeterminate infarct. Consider MRI for  further evaluation. 2. No acute hemorrhage. Electronically Signed   By: Ryan Chess M.D.   On: 08/14/2023 09:23    Microbiology: No results found for this or any previous visit.  Labs: CBC: Recent Labs  Lab 08/14/23 0925 08/14/23 0948  WBC 10.0  --   NEUTROABS 6.1  --   HGB 16.8 16.0  HCT 50.5 47.0  MCV 91.2  --   PLT 226  --    Basic Metabolic Panel: Recent Labs  Lab 08/14/23 0925 08/14/23 0948  NA 135 141  K 3.8 4.0  CL 103 104  CO2 24  --   GLUCOSE 171* 166*  BUN 15 14  CREATININE 0.96 1.00  CALCIUM  9.5  --    Liver Function Tests: Recent Labs  Lab 08/14/23 0925  AST 30  ALT 35  ALKPHOS 68  BILITOT 0.7  PROT 8.2*  ALBUMIN 4.4   CBG: Recent Labs  Lab 08/14/23 0850  GLUCAP 169*    Discharge time spent: greater than 30 minutes.  Signed: Toribio Door, MD [facility] 08/15/2023

## 2023-08-15 NOTE — Progress Notes (Signed)
 Occupational Therapy Treatment Note  Pt seen to establish an HEP for RUE coordination and BUE integrated activities, incorporating visual scanning, eye hand coordination and grading with a cognitive component. Written information provided and reviewed. Recommend follow up with OT at a neuro outpt center.  Continue to recommend refraining from driving at this time and follow up with and eye doctor.    08/15/23 1500  OT Visit Information  Last OT Received On 08/15/23  Assistance Needed +1  History of Present Illness pt 58 y/o in ED admitted with  Right sided weakness and slurred speech. 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 No significant PMH.  Precautions  Precautions Fall  Pain Assessment  Pain Assessment No/denies pain  Exercises  Exercises Other exercises  Other Exercises  Other Exercises Level 3 theraputty grip/pinch strengthening- written hadnout reviewed.  Other Exercises fine motor/coordination HEP, including in-hand manipulation and translation skills  Other Exercises eye hand coordination tasks  Other Exercises BUE intgrated coordination activities  Other Exercises grading activities by addign cognitive component  OT - End of Session  Activity Tolerance Patient tolerated treatment well  Patient left in chair;with call bell/phone within reach;with family/visitor present  Nurse Communication Other (comment) (DC needs)  OT Assessment/Plan  OT Visit Diagnosis Unsteadiness on feet (R26.81);Muscle weakness (generalized) (M62.81);Low vision, both eyes (H54.2);Other symptoms and signs involving the nervous system (R29.898);Other symptoms and signs involving cognitive function;Hemiplegia and hemiparesis  Hemiplegia - Right/Left Right  Hemiplegia - dominant/non-dominant Dominant  Hemiplegia - caused by Cerebral infarction  OT Frequency (ACUTE ONLY) Min 1X/week  Follow Up Recommendations Outpatient OT  Patient can return home with the following Assistance with  cooking/housework;Direct supervision/assist for medications management;Assist for transportation;Direct supervision/assist for financial management  OT Equipment None recommended by OT  AM-PAC OT 6 Clicks Daily Activity Outcome Measure (Version 2)  Help from another person eating meals? 4  Help from another person taking care of personal grooming? 4  Help from another person toileting, which includes using toliet, bedpan, or urinal? 4  Help from another person bathing (including washing, rinsing, drying)? 4  Help from another person to put on and taking off regular upper body clothing? 4  Help from another person to put on and taking off regular lower body clothing? 4  6 Click Score 24  Progressive Mobility  What is the highest level of mobility based on the progressive mobility assessment? Level 5 (Walks with assist in room/hall) - Balance while stepping forward/back and can walk in room with assist - Complete  OT Goal Progression  Progress towards OT goals Goals met/education completed, patient discharged from OT (further OT to be addressed by outpt OT)  Acute Rehab OT Goals  Patient Stated Goal return to work  OT Goal Formulation All assessment and education complete, DC therapy  OT Time Calculation  OT Start Time (ACUTE ONLY) 1235  OT Stop Time (ACUTE ONLY) 1250  OT Time Calculation (min) 15 min  OT General Charges  $OT Visit 1 Visit  OT Treatments  $Therapeutic Activity 8-22 mins   Kreg Sink, OT/L   Acute OT Clinical Specialist Acute Rehabilitation Services Pager [phone] Office [phone]

## 2023-08-15 NOTE — Discharge Instructions (Signed)
 Dear Fairy LELON Givens,   Congratulations for your interest in quitting smoking!  Find a program that suits you best: when you want to quit, how you need support, where you live, and how you like to learn.    If you're ready to get started TODAY, consider scheduling a visit through [email or web address].  Appointments are available from 8am to 8pm, Monday to Friday.   Most health insurance plans will cover some level of tobacco cessation visits and medications.    Additional Resources: [employer] are also available to help you quit & provide the support you'll need. Many programs are available in both English and Spanish and have a long history of successfully helping people get off and stay off tobacco.    Quit Smoking Apps:  quitSTART at seriousbroker.de QuitGuide?at forgetparking.dk Online education and resources: Smokefree  at borders group.gov Free Telephone Coaching: QuitNow,  Call 1-800-QUIT-NOW (843-869-1301) or Text- Ready to 808 277 9874 *Quitline [place] has teamed up with Medicaid to offer a free 14 week program    Vaping- Want to Quit? Free 24/7 support. Call [facility]  [place], [place], [place], [place], [place]  [facility]

## 2023-08-15 NOTE — Progress Notes (Signed)
 Occupational Therapy Evaluation Patient Details Name: Michael Ritter MRN: [id number] DOB: 12/29/1965 Today's Date: 08/15/2023   History of Present Illness pt 58 y/o in ED admitted with  Right sided weakness and slurred speech. 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 No significant PMH.   Clinical Impression   PTA pt lives independently with his fiance, drives and works in office manager. Pt presents with deficits listed below and would benefit from OT at a neuro outpt center to maximize independence with IADL tasks and facilitate safe return to work. Pt with complaints of 'blurred vision prior to CVA and was missing a few targets on the R during double simultaneous stimulation. Given his Internal Capsule CVA, recommend follow up with an eye doctor, including a full field visual assessment prior to return to driving/work. Pt/fiance verbalized understanding. Will return to establish a HEP.       If plan is discharge home, recommend the following: Assistance with cooking/housework;Direct supervision/assist for medications management;Assist for transportation;Direct supervision/assist for financial management    Functional Status Assessment  Patient has had a recent decline in their functional status and demonstrates the ability to make significant improvements in function in a reasonable and predictable amount of time.  Equipment Recommendations  None recommended by OT    Recommendations for Other Services       Precautions / Restrictions Precautions Precautions: Fall (risk just due to stroke a this time, no history of falls) Restrictions Weight Bearing Restrictions Per Provider Order: No      Mobility Bed Mobility Overal bed mobility: Modified Independent                  Transfers Overall transfer level: Modified independent                        Balance                                           ADL either performed or assessed with  clinical judgement   ADL Overall ADL's : Needs assistance/impaired                                     Functional mobility during ADLs: Modified independent (per PT) General ADL Comments: Overall modified independent at this time; recommend direct S with IADL tasks; girlfriend can assist as needed with bathing     Vision Patient Visual Report: Blurring of vision Vision Assessment?: Yes Eye Alignment: Within Functional Limits Ocular Range of Motion: Within Functional Limits Alignment/Gaze Preference: Within Defined Limits Tracking/Visual Pursuits: Decreased smoothness of horizontal tracking;Decreased smoothness of vertical tracking Saccades: Additional eye shifts occurred during testing Convergence: Within functional limits Visual Fields: Other (comment) (decreased ability to identify targets in L visual field at times; difficulty with reading sign in room) Additional Comments: recommend follow up with his eye doctor; phone adjusted to increase accessibility     Perception Perception: Within Functional Limits       Praxis Praxis: WFL       Pertinent Vitals/Pain       Extremity/Trunk Assessment Upper Extremity Assessment Upper Extremity Assessment: Right hand dominant;RUE deficits/detail RUE Deficits / Details: AROM and strngth overall WFL however diminished pinch strength. Decreased fine motor coordination noted;clumsy RUE Coordination: decreased fine motor   Lower Extremity  Assessment Lower Extremity Assessment: Overall WFL for tasks assessed (tested coordinationa nd speed of LLE versus RLE , no notable differrence bewtween the 2 today and strength R=L, no deficits noted in static sitting tests. While waliing pt reported R LE felt tired , but no noticable differrence mechanically during gait)   Cervical / Trunk Assessment Cervical / Trunk Assessment: Normal   Communication Communication Communication: Difficulty communicating thoughts/reduced clarity of  speech (slow , slurred speech, but able to understand. Occassional stuggle with word finding. wife reports this has improved over the last 24 hr, yesterday she could not understand him.)   Cognition Arousal: Alert (reported slightly sleepy during eval and not normally like this) Behavior During Therapy: WFL for tasks assessed/performed Overall Cognitive Status: Impaired/Different from baseline Area of Impairment: Awareness, Attention                   Current Attention Level: Selective       Awareness: Emergent   General Comments: slow processing; difficulty stating months in reverse order - did not recognize errors; feels foggy     General Comments  emotional at times regarding having a stroke adn how it will affect his ability to work    Exercises     Shoulder Instructions      Home Living Family/patient expects to be discharged to:: Private residence Living Arrangements: Spouse/significant other Available Help at Discharge: Family Type of Home: House Home Access: Stairs to enter Secretary/administrator of Steps: 2 Entrance Stairs-Rails: None (educated on how the wife will assist him up those 2 steps with hand held assit and gait belt given) Home Layout: One level     Bathroom Shower/Tub: Producer, Television/film/video: Standard Bathroom Accessibility: Yes   Home Equipment: None          Prior Functioning/Environment Prior Level of Function : Independent/Modified Independent;Working/employed;Driving (works in office manager)                        OT Problem List: Decreased coordination;Decreased cognition;Decreased engineer, building services;Impaired UE functional use      OT Treatment/Interventions: Self-care/ADL training;Therapeutic exercise;Therapeutic activities;Cognitive remediation/compensation;Patient/family education;Visual/perceptual remediation/compensation    OT Goals(Current goals can be found in the care plan section) Acute Rehab OT  Goals Patient Stated Goal: to be able to return to work OT Goal Formulation: With patient Time For Goal Achievement: 08/29/23 Potential to Achieve Goals: Good  OT Frequency: Min 1X/week    Co-evaluation              AM-PAC OT 6 Clicks Daily Activity     Outcome Measure Help from another person eating meals?: None Help from another person taking care of personal grooming?: None Help from another person toileting, which includes using toliet, bedpan, or urinal?: None Help from another person bathing (including washing, rinsing, drying)?: None Help from another person to put on and taking off regular upper body clothing?: None Help from another person to put on and taking off regular lower body clothing?: None 6 Click Score: 24   End of Session Nurse Communication: Other (comment) (DC needs)  Activity Tolerance: Patient tolerated treatment well Patient left: in chair;with call bell/phone within reach;with family/visitor present  OT Visit Diagnosis: Unsteadiness on feet (R26.81);Muscle weakness (generalized) (M62.81);Low vision, both eyes (H54.2);Other symptoms and signs involving the nervous system (R29.898);Other symptoms and signs involving cognitive function;Hemiplegia and hemiparesis Hemiplegia - Right/Left: Right Hemiplegia - dominant/non-dominant: Dominant Hemiplegia - caused by: Cerebral infarction  Time: 8856-8788 OT Time Calculation (min): 28 min Charges:  OT General Charges $OT Visit: 1 Visit OT Evaluation $OT Eval Low Complexity: 1 Low OT Treatments $Self Care/Home Management : 8-22 mins  Kreg Sink, OT/L   Acute OT Clinical Specialist Acute Rehabilitation Services Pager [phone] Office [phone]   [facility] 08/15/2023, 2:57 PM

## 2023-08-18 ENCOUNTER — Encounter: Payer: Self-pay | Admitting: *Deleted

## 2023-08-18 NOTE — Progress Notes (Signed)
Patient enrolled for [employer] to ship a 30 day cardiac event monitor to his address on file.  Letter with instructions mailed to patient.  Dr. Carolan Clines to read.

## 2023-08-19 ENCOUNTER — Other Ambulatory Visit (HOSPITAL_COMMUNITY): Payer: Self-pay

## 2023-08-19 ENCOUNTER — Ambulatory Visit (INDEPENDENT_AMBULATORY_CARE_PROVIDER_SITE_OTHER): Payer: Commercial Managed Care - PPO | Admitting: Internal Medicine

## 2023-08-19 ENCOUNTER — Encounter: Payer: Self-pay | Admitting: Internal Medicine

## 2023-08-19 VITALS — BP 140/90 | HR 84 | Temp 98.2°F | Ht 75.0 in | Wt 289.8 lb

## 2023-08-19 DIAGNOSIS — I639 Cerebral infarction, unspecified: Secondary | ICD-10-CM

## 2023-08-19 DIAGNOSIS — R030 Elevated blood-pressure reading, without diagnosis of hypertension: Secondary | ICD-10-CM | POA: Diagnosis not present

## 2023-08-19 DIAGNOSIS — Z1211 Encounter for screening for malignant neoplasm of colon: Secondary | ICD-10-CM

## 2023-08-19 DIAGNOSIS — Z8673 Personal history of transient ischemic attack (TIA), and cerebral infarction without residual deficits: Secondary | ICD-10-CM | POA: Diagnosis not present

## 2023-08-19 DIAGNOSIS — F17210 Nicotine dependence, cigarettes, uncomplicated: Secondary | ICD-10-CM

## 2023-08-19 DIAGNOSIS — E119 Type 2 diabetes mellitus without complications: Secondary | ICD-10-CM

## 2023-08-19 DIAGNOSIS — Z7985 Long-term (current) use of injectable non-insulin antidiabetic drugs: Secondary | ICD-10-CM

## 2023-08-19 MED ORDER — IBUPROFEN 200 MG PO TABS: 800.0000 mg | ORAL_TABLET | Freq: Four times a day (QID) | ORAL | 3 refills | Status: AC | PRN

## 2023-08-19 MED ORDER — ADULT MULTIVITAMIN W/MINERALS CH: 1.0000 | ORAL_TABLET | Freq: Every day | ORAL | Status: AC

## 2023-08-19 MED ORDER — CLOPIDOGREL BISULFATE 75 MG PO TABS: 75.0000 mg | ORAL_TABLET | Freq: Every day | ORAL | 3 refills | Status: AC

## 2023-08-19 MED ORDER — OZEMPIC (0.25 OR 0.5 MG/DOSE) 2 MG/3ML ~~LOC~~ SOPN
0.2500 mg | PEN_INJECTOR | SUBCUTANEOUS | 5 refills | Status: DC
  Filled 2023-08-19 – 2023-09-05 (×2): qty 3, 28d supply, fill #0

## 2023-08-19 MED ORDER — ASPIRIN 81 MG PO CHEW: 81.0000 mg | CHEWABLE_TABLET | Freq: Every day | ORAL | 3 refills | Status: AC

## 2023-08-19 MED ORDER — ATORVASTATIN CALCIUM 80 MG PO TABS: 80.0000 mg | ORAL_TABLET | Freq: Every day | ORAL | 3 refills | Status: AC

## 2023-08-19 NOTE — Assessment & Plan Note (Signed)
 Smoking Cessation He is currently smoking but has reduced intake. Quitting smoking was emphasized as critical for stroke prevention and overall health, noting it is more beneficial than dietary changes alone. Complete smoking cessation is strongly encouraged.

## 2023-08-19 NOTE — Patient Instructions (Addendum)
 Recommend low carb diet: ratio yogurt, berries, nuts, Extra Virgin Olive Oil, fish, lean protein like chicken Oneil leach has 1 year of your generics Darryle long is working on Ozempic . Strongly encourage close follow up for diabetes and quit smoking.   VISIT SUMMARY:  During today's visit, we discussed your recent stroke, high blood pressure, diabetes, and other health concerns. We focused on lifestyle changes, medication options, and strategies to help you recover and manage your health effectively. Your partner was present and actively involved in the discussion.  YOUR PLAN:  -STROKE: A stroke occurs when blood flow to a part of your brain is interrupted, causing brain cells to die. We discussed using semaglutide  (Ozempic ) to help prevent another stroke, manage diabetes, and aid in weight loss. You should practice conscious swallowing techniques to manage saliva and follow up with speech and occupational therapy. We will provide a work excuse for January 7th and emphasize the importance of anticoagulation therapy.  -HYPERTENSION: Hypertension, or high blood pressure, can increase the risk of stroke and other health issues. We discussed the benefits of semaglutide  for overall health improvement. Continue with dietary changes and work on quitting smoking to help manage your blood pressure.  -DIABETES MELLITUS: Diabetes is a condition where your blood sugar levels are too high. With an A1c of 8, managing your diabetes is crucial to prevent complications. We recommend a low-carb diet including foods like Ratio yogurt, berries, nuts, extra virgin olive oil, and fish, and advise against high-sugar fruits like bananas and pineapples. Semaglutide  can also help control your diabetes and assist with weight loss.  -HYPERLIPIDEMIA: Hyperlipidemia means having high levels of fats (lipids) in your blood, which can increase the risk of heart disease. You are currently taking Lipitor, and we discussed the  possibility of discontinuing it with proper diet and weight loss. For now, continue taking Lipitor and focus on dietary changes.  -SMOKING CESSATION: Smoking increases the risk of stroke and other health problems. Although you have reduced your smoking, quitting completely is critical for your health. We strongly encourage you to stop smoking entirely as it will have a significant positive impact on your overall health.  -GENERAL HEALTH MAINTENANCE: We discussed the importance of diet, medication management, and follow-up care. [employer] was recommended for affordable medication options. Managing stress and maintaining employment during your stroke recovery are also important. Regular follow-up appointments will help monitor your health status.  INSTRUCTIONS:  Please schedule a follow-up appointment to monitor your recovery and health status. We will also complete FMLA paperwork to ensure your job security. Close follow-up for diabetes management and smoking cessation is advised.  Welcome aboard!   Today's visit was a valuable first step in understanding your health and starting your personalized care journey. We discussed your medical history and medications in detail. Given the extensive information, we prioritized addressing your most pressing concerns.  We understood those concerns to be:  New Patient (Initial Visit) (Recently had a stroke on 08/13/23.)   Building a Complete Picture  To create the most effective care plan possible, we may need additional information from previous providers. We encouraged you to gather any relevant medical records for your next visit. This will help us  build a more complete picture and develop a personalized plan together. In the meantime, we'll address your immediate concerns and provide resources to help you manage all of your medical issues.  We encourage you to use MyChart to review these efforts, and to help us  find  and correct any omissions  or errors in your medical chart.  Managing Your Health Over Time  Managing every aspect of your health in a single visit isn't always feasible, but that's okay.  We addressed your most pressing concerns today and charted a course for future care. Acute conditions or preventive care measures may require further attention.  We encourage you to schedule a follow-up visit at your earliest convenience to discuss any unresolved issues.  We strongly encourage participation in annual preventive care visits to help us  develop a more thorough understanding of your health and to help you maintain optimal wellness - please inquire about scheduling your next one with us  at your earliest convenience.  Your Satisfaction Matters  It was a pleasure seeing you today!  Your health and satisfaction will always be my top priorities. If you believe your experience today was worthy of a 5-star rating, I'd be grateful for your feedback!  Bernardino KANDICE Cone, MD   Next Steps  Schedule Follow-Up:  We recommend a follow-up appointment in No follow-ups on file. If your condition worsens before then, please call us  or seek emergency care. Preventive Care:  Don't forget to schedule your annual preventive care visit!  This important checkup is typically covered by insurance and helps identify potential health issues early.  Typically its 100% insurance covered with no co-pay and helps to get surveillance labwork paid for through your insurance provider.  Sometimes it even lowers your insurance premiums to participate. Medical Information Release:  For any relevant medical information we don't have, please sign a release form so we can obtain it for your records. Lab & X-ray Appointments:  Scheduled any incomplete lab tests today or call us  to schedule.  X-Rays can be done without an appointment at [facility] ([address]), M-F 8:30am-noon or 1pm-5pm.  Just tell them you're there for X-rays ordered by  Dr. Cone.  We'll receive the results and contact you by phone or MyChart to discuss next steps.  Bring to Your Next Appointment  Medications: Please bring all your medication bottles to your next appointment to ensure we have an accurate record of your prescriptions. Health Diaries: If you're monitoring any health conditions at home, keeping a diary of your readings can be very helpful for discussions at your next appointment.  Reviewing Your Records  Please Review this early draft of your clinical notes below and the final encounter summary tomorrow on MyChart after its been completed.   Acute CVA (cerebrovascular accident) (HCC) -     Ozempic  (0.25 or 0.5 MG/DOSE); Inject 0.25 mg into the skin once a week.  Dispense: 3 mL; Refill: 5 -     Aspirin ; Chew 1 tablet (81 mg total) by mouth daily.  Dispense: 100 tablet; Refill: 3 -     Atorvastatin  Calcium ; Take 1 tablet (80 mg total) by mouth daily.  Dispense: 90 tablet; Refill: 3 -     Clopidogrel  Bisulfate; Take 1 tablet (75 mg total) by mouth daily.  Dispense: 90 tablet; Refill: 3 -     multivitamin with minerals; Take 1 tablet by mouth daily.  Screening for colon cancer -     Cologuard  Type 2 diabetes mellitus without complication, without long-term current use of insulin (HCC) -     Microalbumin / creatinine urine ratio -     Ibuprofen ; Take 4 tablets (800 mg total) by mouth every 6 (six) hours as needed for moderate pain (pain score  4-6).  Dispense: 90 tablet; Refill: 3 -     Microalbumin / creatinine urine ratio  Cigarette smoker  Elevated blood pressure reading     Getting Answers and Following Up  Simple Questions & Concerns: For quick questions or basic follow-up after your visit, reach us  at [phone] or MyChart messaging. Complex Concerns: If your concern is more complex, scheduling an appointment might be best. Discuss this with the staff to find the most suitable option. Lab & Imaging Results: We'll contact you  directly if results are abnormal or you don't use MyChart. Most normal results will be on MyChart within 2-3 business days, with a review message from Dr. Jesus. Haven't heard back in 2 weeks? Need results sooner? Contact us  at [phone]. Referrals: Our referral coordinator will manage specialist referrals. The specialist's office should contact you within 2 weeks to schedule an appointment. Call us  if you haven't heard from them after 2 weeks.  Staying Connected  MyChart: Activate your MyChart for the fastest way to access results and message us . See the last page of this paperwork for instructions.  Billing  X-ray & Lab Orders: These are billed by separate companies. Contact the invoicing company directly for questions or concerns. Visit Charges: Discuss any billing inquiries with our administrative services team.  Feedback & Satisfaction  Share Your Experience: We strive for your satisfaction! If you have any complaints, please let Dr. Jesus know directly or contact our Practice Administrators, Manuelita Rubin or [employer], by asking at the front desk.  Scheduling Tips  Shorter Wait Times: 8 am and 1 pm appointments often have the quickest wait times. Longer Appointments: If you need more time during your visit, talk to the front desk. Due to insurance regulations, multiple back-to-back appointments might be necessary.

## 2023-08-19 NOTE — Assessment & Plan Note (Signed)
 Diabetes Mellitus With mild diabetes and an A1c of 8 contributing to his stroke risk, we discussed diabetes management to prevent complications, highlighting semaglutide 's role in control and weight loss. A low-carb diet including Ratio yogurt, berries, nuts, extra virgin olive oil, and fish is recommended, advising against high-sugar fruits like bananas and pineapples. The importance of managing diabetes to prevent further complications was emphasized.

## 2023-08-19 NOTE — Assessment & Plan Note (Signed)
 Hypertension His blood pressure remains elevated post-stroke, and he is not currently on antihypertensive medications, preferring lifestyle modifications. The potential benefits of semaglutide  for overall health improvement were discussed. Dietary changes and smoking cessation are encouraged to manage blood pressure.

## 2023-08-19 NOTE — Assessment & Plan Note (Signed)
 Stroke Following a recent stroke on January 1st, he exhibits residual symptoms including coughing fits and speech difficulties, placing him at a high risk for recurrence. We discussed the benefits of semaglutide  (Ozempic ) for stroke prevention, diabetes management, and weight loss, highlighting its FDA approval and efficacy in preventing fatty liver. Conscious swallowing techniques were encouraged to manage saliva. We will order semaglutide , recommend follow-up with speech and occupational therapy, and emphasize the importance of anticoagulation. A work excuse for January 7th will be provided, and close follow-up for stroke recovery is encouraged.

## 2023-08-19 NOTE — Progress Notes (Signed)
 [facility]  Phone: [phone]  - Medical Office Visit -  Visit Date: 08/19/2023 Patient: Michael Ritter   DOB: Mar 09, 1966   58 y.o. Male  MRN: [id number] Patient Care Team: Jesus Bernardino MATSU, MD as PCP - General (Internal Medicine) Today's Health Care Provider: Bernardino MATSU Jesus, MD  ===========================================   Chief complaint: new stroke new patient hospital follow up.  Assessment & Plan Acute CVA (cerebrovascular accident) [facility]) Stroke Following a recent stroke on January 1st, he exhibits residual symptoms including coughing fits and speech difficulties, placing him at a high risk for recurrence. We discussed the benefits of semaglutide  (Ozempic ) for stroke prevention, diabetes management, and weight loss, highlighting its FDA approval and efficacy in preventing fatty liver. Conscious swallowing techniques were encouraged to manage saliva. We will order semaglutide , recommend follow-up with speech and occupational therapy, and emphasize the importance of anticoagulation. A work excuse for January 7th will be provided, and close follow-up for stroke recovery is encouraged. Screening for colon cancer  Type 2 diabetes mellitus without complication, without long-term current use of insulin (HCC) Diabetes Mellitus With mild diabetes and an A1c of 8 contributing to his stroke risk, we discussed diabetes management to prevent complications, highlighting semaglutide 's role in control and weight loss. A low-carb diet including Ratio yogurt, berries, nuts, extra virgin olive oil, and fish is recommended, advising against high-sugar fruits like bananas and pineapples. The importance of managing diabetes to prevent further complications was emphasized. Cigarette smoker Smoking Cessation He is currently smoking but has reduced intake. Quitting smoking was emphasized as critical for stroke prevention and overall health, noting it is more beneficial than dietary  changes alone. Complete smoking cessation is strongly encouraged. Elevated blood pressure reading Hypertension His blood pressure remains elevated post-stroke, and he is not currently on antihypertensive medications, preferring lifestyle modifications. The potential benefits of semaglutide  for overall health improvement were discussed. Dietary changes and smoking cessation are encouraged to manage blood pressure.  General Health Maintenance Health maintenance discussions included diet, medication management, and follow-up care. [employer] [facility] was recommended for affordable medication options. Follow-up appointments to monitor health status are encouraged, along with managing stress and maintaining employment during stroke recovery.  Follow-up A follow-up appointment will be scheduled, and completing FMLA paperwork to ensure job security is recommended. Close follow-up for diabetes and smoking cessation is advised.    Diagnoses and all orders for this visit: Acute CVA (cerebrovascular accident) (HCC) -     Semaglutide ,0.25 or 0.5MG /DOS, (OZEMPIC , 0.25 OR 0.5 MG/DOSE,) 2 MG/3ML SOPN; Inject 0.25 mg into the skin once a week. -     aspirin  81 MG chewable tablet; Chew 1 tablet (81 mg total) by mouth daily. -     atorvastatin  (LIPITOR) 80 MG tablet; Take 1 tablet (80 mg total) by mouth daily. -     clopidogrel  (PLAVIX ) 75 MG tablet; Take 1 tablet (75 mg total) by mouth daily. -     Multiple Vitamin (MULTIVITAMIN WITH MINERALS) TABS tablet; Take 1 tablet by mouth daily. Screening for colon cancer -     Cologuard Type 2 diabetes mellitus without complication, without long-term current use of insulin (HCC) -     Microalbumin / creatinine urine ratio -     ibuprofen  (ADVIL ) 200 MG tablet; Take 4 tablets (800 mg total) by mouth every 6 (six) hours as needed for moderate pain (pain score 4-6). -     Microalbumin / creatinine urine ratio Cigarette smoker  @Recommended  follow  up: No  follow-ups on file. Future Appointments  Date Time Provider Department Center  08/28/2023  2:20 PM Alvan Ronal BRAVO, MD [facility] None  09/22/2023  3:20 PM Jesus Bernardino MATSU, MD [facility]      Subjective  58 y.o. male who has Thyroid nodule; Acute CVA (cerebrovascular accident) (HCC); Cigarette smoker; Hypoxia; Elevated blood pressure reading; and DM type 2 (diabetes mellitus, type 2) (HCC) on their problem list. His reasons/main concerns/chief complaints for today's office visit are New Patient (Initial Visit) (Recently had a stroke on 08/13/23.)   ----------------------------------------------------------------------------------------------------------------- AI-Extracted: Discussed the use of AI scribe software for clinical note transcription with the patient, who gave verbal consent to proceed. 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 The patient is not currently taking any medication and is focusing on lifestyle modifications, including dietary changes and smoking cessation. The patient reports significant improvement in walking and speech since the stroke, which occurred six days prior to the consultation.  The patient has a history of mild diabetes, with a recent HbA1c of 8. He expresses a desire to manage this through dietary changes, focusing on a low-carb diet. The patient reports a significant reduction in smoking, from a pack a day to the same pack lasting three to four days.  The patient also reports a recent issue with coughing fits and a sensation of choking, which he attributes to saliva management issues post-stroke. He expresses concern about the potential impact of this on his employment.  The patient has been managing his recovery primarily through self-care and lifestyle modifications, with a focus on returning to work as soon as possible. He  expresses a strong desire to maintain his employment and is concerned about the financial implications of his health issues.  The patient's partner is present during the consultation and is actively involved in the patient's care and recovery. They express concern about the patient's diet and the challenges of implementing dietary changes. They also express concern about the patient's ongoing smoking habit.  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 The patient is actively working on lifestyle modifications, including dietary changes and smoking cessation, to manage his health issues.   ----------------------------------------------------------------------------------------------------------------- Problem list overviews that were updated at today's visit: No problems updated.  Medications reviewed and modified: No current outpatient medications on file prior to visit.   No current facility-administered medications on file prior to visit.   Medications Discontinued During This Encounter  Medication Reason   Multiple Vitamin (MULTIVITAMIN WITH MINERALS) TABS tablet Reorder   ibuprofen  (ADVIL ,MOTRIN ) 200 MG tablet Reorder   aspirin  81 MG chewable tablet Reorder   atorvastatin  (LIPITOR) 80 MG tablet Reorder   clopidogrel  (PLAVIX ) 75 MG tablet Reorder    Problems: has Thyroid nodule; Acute CVA (cerebrovascular accident) (HCC); Cigarette smoker; Hypoxia; Elevated blood pressure reading; and DM type 2 (diabetes mellitus, type 2) (HCC) on their problem list. Current Meds  Medication Sig   Semaglutide ,0.25 or 0.5MG /DOS, (OZEMPIC , 0.25 OR 0.5 MG/DOSE,) 2 MG/3ML SOPN Inject 0.25 mg into the skin once a week.   [DISCONTINUED] aspirin  81 MG chewable tablet Chew 1 tablet (81 mg total) by mouth daily.   [DISCONTINUED]  atorvastatin  (LIPITOR) 80 MG tablet Take 1 tablet (80 mg  total) by mouth daily.   [DISCONTINUED] clopidogrel  (PLAVIX ) 75 MG tablet Take 1 tablet (75 mg total) by mouth daily.   [DISCONTINUED] ibuprofen  (ADVIL ,MOTRIN ) 200 MG tablet Take 800 mg by mouth every 6 (six) hours as needed for moderate pain.   [DISCONTINUED] Multiple Vitamin (MULTIVITAMIN WITH MINERALS) TABS tablet Take 1 tablet by mouth daily.   Allergies:   Allergies  Allergen Reactions   Penicillins     Other reaction(s): Unknown   Past Medical History:  has a past medical history of Acute CVA (cerebrovascular accident) (HCC) (08/14/2023), DM type 2 (diabetes mellitus, type 2) (HCC) (08/15/2023), Hyperlipidemia, Hypertension, and Urinary tract infection. Past Surgical History:   has a past surgical history that includes Vasectomy (2002). Social History:   reports that he has been smoking cigarettes. He does not have any smokeless tobacco history on file. He reports that he does not currently use alcohol. He reports that he does not use drugs. Family History:  family history includes Hypertension in his father. Depression Screen and Health Maintenance:    08/19/2023   10:12 AM  PHQ 2/9 Scores  PHQ - 2 Score 0  PHQ- 9 Score 2   Health Maintenance  Topic Date Due   FOOT EXAM  Never done   OPHTHALMOLOGY EXAM  Never done   Diabetic kidney evaluation - Urine ACR  Never done   Colonoscopy  Never done   INFLUENZA VACCINE  11/10/2023 (Originally 03/13/2023)   Zoster Vaccines- Shingrix (1 of 2) 11/17/2023 (Originally 07/28/2017)   DTaP/Tdap/Td (2 - Td or Tdap) 08/18/2024 (Originally 08/12/2022)   Hepatitis C Screening  08/18/2024 (Originally 07/28/1985)   HEMOGLOBIN A1C  02/11/2024   Diabetic kidney evaluation - eGFR measurement  08/13/2024   HIV Screening  Completed   HPV VACCINES  Aged Out   COVID-19 Vaccine  Discontinued   Immunization History  Administered Date(s) Administered   Tdap 08/12/2012     Objective   Physical  ExamBP (!) 140/90   Pulse 84   Temp 98.2 F (36.8 C) (Temporal)   Ht 6' 3 (1.905 m)   Wt 289 lb 12.8 oz (131.5 kg)   SpO2 95%   BMI 36.22 kg/m  Wt Readings from Last 10 Encounters:  08/19/23 289 lb 12.8 oz (131.5 kg)  03/16/14 262 lb (118.8 kg)  Vital signs reviewed.  Nursing notes reviewed. Weight trend reviewed. Abnormalities and problem-specific physical exam findings:  slurred speech truncal adiposity  General Appearance:  Well developed, well nourished, well-groomed, healthy-appearing male with Body mass index is 36.22 kg/m. No acute distress appreciable.   Skin: Clear and well-hydrated. Pulmonary:  Normal work of breathing at rest, no respiratory distress apparent. SpO2: 95 %  Musculoskeletal: He demonstrates smooth and coordinated movements throughout all major joints.All extremities are intact.  Neurological:  Awake, alert, oriented, and engaged.  No obvious focal neurological deficits or cognitive impairments.  Sensorium seems unclouded.  Psychiatric:  Appropriate mood, pleasant and cooperative demeanor, cheerful and engaged during the exam  Reviewed Results & Data     No results found for any visits on 08/19/23.  Admission on 08/14/2023, Discharged on 08/15/2023  Component Date Value   Glucose-Capillary 08/14/2023 169 (H)    Prothrombin Time 08/14/2023 12.8    INR 08/14/2023 0.9    aPTT 08/14/2023 24    WBC 08/14/2023 10.0    RBC 08/14/2023 5.54    Hemoglobin 08/14/2023 16.8    HCT 08/14/2023 50.5    MCV 08/14/2023 91.2    [facility] 08/14/2023  30.3    MCHC 08/14/2023 33.3    RDW 08/14/2023 13.5    Platelets 08/14/2023 226    nRBC 08/14/2023 0.0    Neutrophils Relative % 08/14/2023 59    Neutro Abs 08/14/2023 6.1    Lymphocytes Relative 08/14/2023 29    Lymphs Abs 08/14/2023 2.9    Monocytes Relative 08/14/2023 7    Monocytes Absolute 08/14/2023 0.7    Eosinophils Relative 08/14/2023 3    Eosinophils Absolute 08/14/2023 0.3    Basophils Relative 08/14/2023 1     Basophils Absolute 08/14/2023 0.1    Immature Granulocytes 08/14/2023 1    Abs Immature Granulocytes 08/14/2023 0.05    Sodium 08/14/2023 135    Potassium 08/14/2023 3.8    Chloride 08/14/2023 103    CO2 08/14/2023 24    Glucose, Bld 08/14/2023 171 (H)    BUN 08/14/2023 15    Creatinine, Ser 08/14/2023 0.96    Calcium  08/14/2023 9.5    Total Protein 08/14/2023 8.2 (H)    Albumin 08/14/2023 4.4    AST 08/14/2023 30    ALT 08/14/2023 35    Alkaline Phosphatase 08/14/2023 68    Total Bilirubin 08/14/2023 0.7    GFR, Estimated 08/14/2023 >60    Anion gap 08/14/2023 8    Alcohol, Ethyl (B) 08/14/2023 <10    Sodium 08/14/2023 141    Potassium 08/14/2023 4.0    Chloride 08/14/2023 104    BUN 08/14/2023 14    Creatinine, Ser 08/14/2023 1.00    Glucose, Bld 08/14/2023 166 (H)    Calcium , Ion 08/14/2023 1.18    TCO2 08/14/2023 24    Hemoglobin 08/14/2023 16.0    HCT 08/14/2023 47.0    Cholesterol 08/14/2023 208 (H)    Triglycerides 08/14/2023 106    HDL 08/14/2023 46    Total CHOL/HDL Ratio 08/14/2023 4.5    VLDL 08/14/2023 21    LDL Cholesterol 08/14/2023 141 (H)    Hgb A1c MFr Bld 08/14/2023 8.0 (H)    Mean Plasma Glucose 08/14/2023 183    HIV Screen 4th Generatio* 08/15/2023 Non Reactive    BP 08/14/2023 178/93    Single Plane A2C EF 08/14/2023 53.1    Single Plane A4C EF 08/14/2023 61.0    Calc EF 08/14/2023 57.8    S' Lateral 08/14/2023 3.50    Area-P 1/2 08/14/2023 4.68    Est EF 08/14/2023 55    No image results found.   ECHOCARDIOGRAM COMPLETE Result Date: 08/14/2023    ECHOCARDIOGRAM REPORT   Patient Name:   TJ KITCHINGS Date of Exam: 08/14/2023 Medical Rec #:  [id number]       Height:       75.0 in Accession #:    [id number]      Weight:       262.0 lb Date of Birth:  Oct 06, 1965      BSA:          2.461 m Patient Age:    57 years        BP:           180/95 mmHg Patient Gender: M               HR:           78 bpm. Exam Location:  Inpatient Procedure: 2D Echo,  Cardiac Doppler, Color Doppler, Saline Contrast Bubble Study            and Intracardiac Opacification Agent Indications:    CVA  History:  Patient has no prior history of Echocardiogram examinations.                 Risk Factors:Current Smoker.  Sonographer:    Lanell Maduro Referring Phys: [id number] MIR M IKRAMULLAH IMPRESSIONS  1. Left ventricular ejection fraction, by estimation, is 55%. The left ventricle has normal function. The left ventricle has no regional wall motion abnormalities. There is moderate concentric left ventricular hypertrophy. Left ventricular diastolic parameters are consistent with Grade I diastolic dysfunction (impaired relaxation).  2. The IVC was not visualized. Right ventricular systolic function is normal. The right ventricular size is normal. Tricuspid regurgitation signal is inadequate for assessing PA pressure.  3. The mitral valve is normal in structure. No evidence of mitral valve regurgitation. No evidence of mitral stenosis.  4. The aortic valve is tricuspid. Aortic valve regurgitation is not visualized. No aortic stenosis is present.  5. Aortic dilatation noted. There is mild dilatation of the ascending aorta, measuring 38 mm.  6. Bubble study was probably negative but images were poor.  7. Technically difficult study with poor acoustic windows. FINDINGS  Left Ventricle: Left ventricular ejection fraction, by estimation, is 55%. The left ventricle has normal function. The left ventricle has no regional wall motion abnormalities. Definity  contrast agent was given IV to delineate the left ventricular endocardial borders. The left ventricular internal cavity size was normal in size. There is moderate concentric left ventricular hypertrophy. Left ventricular diastolic parameters are consistent with Grade I diastolic dysfunction (impaired relaxation). Right Ventricle: The IVC was not visualized. The right ventricular size is normal. Right vetricular wall thickness was not well  visualized. Right ventricular systolic function is normal. Tricuspid regurgitation signal is inadequate for assessing PA pressure. Left Atrium: Left atrial size was normal in size. Right Atrium: Right atrial size was normal in size. Pericardium: There is no evidence of pericardial effusion. Mitral Valve: The mitral valve is normal in structure. No evidence of mitral valve regurgitation. No evidence of mitral valve stenosis. Tricuspid Valve: The tricuspid valve is normal in structure. Tricuspid valve regurgitation is trivial. Aortic Valve: The aortic valve is tricuspid. Aortic valve regurgitation is not visualized. No aortic stenosis is present. Pulmonic Valve: The pulmonic valve was normal in structure. Pulmonic valve regurgitation is not visualized. Aorta: The aortic root is normal in size and structure and aortic dilatation noted. There is mild dilatation of the ascending aorta, measuring 38 mm. Venous: The inferior vena cava was not well visualized. IAS/Shunts: Bubble study was probably negative but images were poor. Agitated saline contrast was given intravenously to evaluate for intracardiac shunting.  LEFT VENTRICLE PLAX 2D LVIDd:         4.40 cm      Diastology LVIDs:         3.50 cm      LV e' medial:    5.87 cm/s LV PW:         1.50 cm      LV E/e' medial:  8.9 LV IVS:        1.60 cm      LV e' lateral:   5.11 cm/s LVOT diam:     2.50 cm      LV E/e' lateral: 10.3 LV SV:         97 LV SV Index:   39 LVOT Area:     4.91 cm  LV Volumes (MOD) LV vol d, MOD A2C: 125.0 ml LV vol d, MOD A4C: 144.0 ml LV vol s, MOD A2C:  58.6 ml LV vol s, MOD A4C: 56.1 ml LV SV MOD A2C:     66.4 ml LV SV MOD A4C:     144.0 ml LV SV MOD BP:      78.6 ml RIGHT VENTRICLE RV Basal diam:  3.40 cm RV S prime:     11.40 cm/s TAPSE (M-mode): 2.9 cm LEFT ATRIUM             Index        RIGHT ATRIUM           Index LA diam:        4.00 cm 1.63 cm/m   RA Area:     11.60 cm LA Vol (A2C):   44.1 ml 17.92 ml/m  RA Volume:   22.10 ml  8.98  ml/m LA Vol (A4C):   28.1 ml 11.42 ml/m LA Biplane Vol: 36.0 ml 14.63 ml/m  AORTIC VALVE LVOT Vmax:   106.00 cm/s LVOT Vmean:  72.300 cm/s LVOT VTI:    0.198 m  AORTA Ao Root diam: 3.40 cm Ao Asc diam:  3.80 cm MITRAL VALVE MV Area (PHT): 4.68 cm    SHUNTS MV Decel Time: 162 msec    Systemic VTI:  0.20 m MV E velocity: 52.40 cm/s  Systemic Diam: 2.50 cm MV A velocity: 99.00 cm/s MV E/A ratio:  0.53 Dalton McleanMD Electronically signed by Ezra Kanner Signature Date/Time: 08/14/2023/5:38:42 PM    Final    MR BRAIN WO CONTRAST Result Date: 08/14/2023 CLINICAL DATA:  Neuro deficit, acute, stroke suspected. Slurred speech and right-sided weakness. Headache. EXAM: MRI HEAD WITHOUT CONTRAST TECHNIQUE: Multiplanar, multiecho pulse sequences of the brain and surrounding structures were obtained without intravenous contrast. COMPARISON:  Head CT and CTA 08/14/2023 FINDINGS: Brain: There is a 1.5 cm acute infarct involving the posterior limb of the left internal capsule, corresponding to the asymmetric hypodensity described on today's CT. Scattered punctate T2 hyperintensities elsewhere in the cerebral white matter bilaterally are nonspecific but compatible with minimal chronic small vessel ischemic disease. Cerebral volume is within normal limits for age with normal size of the ventricles. No intracranial hemorrhage, mass, midline shift, or extra-axial fluid collection is identified. Vascular: Major intracranial vascular flow voids are preserved. Skull and upper cervical spine: Unremarkable bone marrow signal. Sinuses/Orbits: Unremarkable orbits. Moderate mucosal thickening in the paranasal sinuses. Clear mastoid air cells. Other: None. IMPRESSION: Acute left internal capsule infarct. Electronically Signed   By: Dasie Hamburg M.D.   On: 08/14/2023 13:17   CT ANGIO HEAD NECK W WO CM Result Date: 08/14/2023 CLINICAL DATA:  Neuro deficit with acute stroke suspected EXAM: CT ANGIOGRAPHY HEAD AND NECK WITH AND WITHOUT  CONTRAST TECHNIQUE: Multidetector CT imaging of the head and neck was performed using the standard protocol during bolus administration of intravenous contrast. Multiplanar CT image reconstructions and MIPs were obtained to evaluate the vascular anatomy. Carotid stenosis measurements (when applicable) are obtained utilizing NASCET criteria, using the distal internal carotid diameter as the denominator. RADIATION DOSE REDUCTION: This exam was performed according to the departmental dose-optimization program which includes automated exposure control, adjustment of the mA and/or kV according to patient size and/or use of iterative reconstruction technique. CONTRAST:  75mL OMNIPAQUE  IOHEXOL  350 MG/ML SOLN COMPARISON:  Head CT from earlier today. FINDINGS: CTA NECK FINDINGS Aortic arch: Unremarkable with 3 vessel branching Right carotid system: Atheromatous wall thickening of the common internal carotid arteries. Mixed density plaque accentuated at the bifurcation without significant stenosis and no ulceration. No beading  or dissection. Left carotid system: Atheromatous wall thickening of the common carotid with accentuated mixed density plaque at the bifurcation. No stenosis or ulceration. Vertebral arteries: The vertebral arteries are smoothly contoured and diffusely patent. No proximal subclavian stenosis. Skeleton: Unremarkable Other neck: Patchy bilateral paranasal sinus opacification, incidental to the history. Upper chest: No acute finding Review of the MIP images confirms the above findings CTA HEAD FINDINGS Anterior circulation: No significant stenosis, proximal occlusion, aneurysm, or vascular malformation. Generalized atheromatous type irregularity of medium size branches. Posterior circulation: Mild atheromatous irregularity of the posterior cerebral artery especially on the right where there is a mild-to-moderate P3 stenosis. Venous sinuses: Unremarkable Anatomic variants: None significant Review of the MIP  images confirms the above findings IMPRESSION: 1. No emergent finding. 2. Cervical and intracranial atherosclerosis without flow reducing stenosis or ulceration of major arteries in the head and neck. Electronically Signed   By: Dorn Roulette M.D.   On: 08/14/2023 11:28   CT HEAD WO CONTRAST Result Date: 08/14/2023 CLINICAL DATA:  Neuro deficit, acute, stroke suspected. Slurred speech and right-sided weakness. EXAM: CT HEAD WITHOUT CONTRAST TECHNIQUE: Contiguous axial images were obtained from the base of the skull through the vertex without intravenous contrast. RADIATION DOSE REDUCTION: This exam was performed according to the departmental dose-optimization program which includes automated exposure control, adjustment of the mA and/or kV according to patient size and/or use of iterative reconstruction technique. COMPARISON:  None Available. FINDINGS: Brain: No acute hemorrhage. Possible asymmetric hypoattenuation in the posterior limb of the left internal capsule (axial image 18 series 2). Cortical gray-white differentiation is preserved. No hydrocephalus or extra-axial collection. No mass effect or midline shift. Vascular: No hyperdense vessel or unexpected calcification. Skull: No calvarial fracture or suspicious bone lesion. Skull base is unremarkable. Sinuses/Orbits: Mild pansinus disease.  Orbits are unremarkable. Other: None. IMPRESSION: 1. Possible asymmetric hypoattenuation in the posterior limb of the left internal capsule, which could represent an age-indeterminate infarct. Consider MRI for further evaluation. 2. No acute hemorrhage. Electronically Signed   By: Lileigh Fahringer Chess M.D.   On: 08/14/2023 09:23    ECHOCARDIOGRAM COMPLETE Result Date: 08/14/2023    ECHOCARDIOGRAM REPORT   Patient Name:   RAMBO SARAFIAN Date of Exam: 08/14/2023 Medical Rec #:  [id number]       Height:       75.0 in Accession #:    [id number]      Weight:       262.0 lb Date of Birth:  Mar 16, 1966      BSA:          2.461 m  Patient Age:    57 years        BP:           180/95 mmHg Patient Gender: M               HR:           78 bpm. Exam Location:  Inpatient Procedure: 2D Echo, Cardiac Doppler, Color Doppler, Saline Contrast Bubble Study            and Intracardiac Opacification Agent Indications:    CVA  History:        Patient has no prior history of Echocardiogram examinations.                 Risk Factors:Current Smoker.  Sonographer:    Lanell Maduro Referring Phys: [id number] MIR M IKRAMULLAH IMPRESSIONS  1. Left ventricular ejection fraction, by estimation, is 55%. The left  ventricle has normal function. The left ventricle has no regional wall motion abnormalities. There is moderate concentric left ventricular hypertrophy. Left ventricular diastolic parameters are consistent with Grade I diastolic dysfunction (impaired relaxation).  2. The IVC was not visualized. Right ventricular systolic function is normal. The right ventricular size is normal. Tricuspid regurgitation signal is inadequate for assessing PA pressure.  3. The mitral valve is normal in structure. No evidence of mitral valve regurgitation. No evidence of mitral stenosis.  4. The aortic valve is tricuspid. Aortic valve regurgitation is not visualized. No aortic stenosis is present.  5. Aortic dilatation noted. There is mild dilatation of the ascending aorta, measuring 38 mm.  6. Bubble study was probably negative but images were poor.  7. Technically difficult study with poor acoustic windows. FINDINGS  Left Ventricle: Left ventricular ejection fraction, by estimation, is 55%. The left ventricle has normal function. The left ventricle has no regional wall motion abnormalities. Definity  contrast agent was given IV to delineate the left ventricular endocardial borders. The left ventricular internal cavity size was normal in size. There is moderate concentric left ventricular hypertrophy. Left ventricular diastolic parameters are consistent with Grade I diastolic  dysfunction (impaired relaxation). Right Ventricle: The IVC was not visualized. The right ventricular size is normal. Right vetricular wall thickness was not well visualized. Right ventricular systolic function is normal. Tricuspid regurgitation signal is inadequate for assessing PA pressure. Left Atrium: Left atrial size was normal in size. Right Atrium: Right atrial size was normal in size. Pericardium: There is no evidence of pericardial effusion. Mitral Valve: The mitral valve is normal in structure. No evidence of mitral valve regurgitation. No evidence of mitral valve stenosis. Tricuspid Valve: The tricuspid valve is normal in structure. Tricuspid valve regurgitation is trivial. Aortic Valve: The aortic valve is tricuspid. Aortic valve regurgitation is not visualized. No aortic stenosis is present. Pulmonic Valve: The pulmonic valve was normal in structure. Pulmonic valve regurgitation is not visualized. Aorta: The aortic root is normal in size and structure and aortic dilatation noted. There is mild dilatation of the ascending aorta, measuring 38 mm. Venous: The inferior vena cava was not well visualized. IAS/Shunts: Bubble study was probably negative but images were poor. Agitated saline contrast was given intravenously to evaluate for intracardiac shunting.  LEFT VENTRICLE PLAX 2D LVIDd:         4.40 cm      Diastology LVIDs:         3.50 cm      LV e' medial:    5.87 cm/s LV PW:         1.50 cm      LV E/e' medial:  8.9 LV IVS:        1.60 cm      LV e' lateral:   5.11 cm/s LVOT diam:     2.50 cm      LV E/e' lateral: 10.3 LV SV:         97 LV SV Index:   39 LVOT Area:     4.91 cm  LV Volumes (MOD) LV vol d, MOD A2C: 125.0 ml LV vol d, MOD A4C: 144.0 ml LV vol s, MOD A2C: 58.6 ml LV vol s, MOD A4C: 56.1 ml LV SV MOD A2C:     66.4 ml LV SV MOD A4C:     144.0 ml LV SV MOD BP:      78.6 ml RIGHT VENTRICLE RV Basal diam:  3.40 cm RV S prime:  11.40 cm/s TAPSE (M-mode): 2.9 cm LEFT ATRIUM             Index         RIGHT ATRIUM           Index LA diam:        4.00 cm 1.63 cm/m   RA Area:     11.60 cm LA Vol (A2C):   44.1 ml 17.92 ml/m  RA Volume:   22.10 ml  8.98 ml/m LA Vol (A4C):   28.1 ml 11.42 ml/m LA Biplane Vol: 36.0 ml 14.63 ml/m  AORTIC VALVE LVOT Vmax:   106.00 cm/s LVOT Vmean:  72.300 cm/s LVOT VTI:    0.198 m  AORTA Ao Root diam: 3.40 cm Ao Asc diam:  3.80 cm MITRAL VALVE MV Area (PHT): 4.68 cm    SHUNTS MV Decel Time: 162 msec    Systemic VTI:  0.20 m MV E velocity: 52.40 cm/s  Systemic Diam: 2.50 cm MV A velocity: 99.00 cm/s MV E/A ratio:  0.53 Dalton McleanMD Electronically signed by Ezra Kanner Signature Date/Time: 08/14/2023/5:38:42 PM    Final    MR BRAIN WO CONTRAST Result Date: 08/14/2023 CLINICAL DATA:  Neuro deficit, acute, stroke suspected. Slurred speech and right-sided weakness. Headache. EXAM: MRI HEAD WITHOUT CONTRAST TECHNIQUE: Multiplanar, multiecho pulse sequences of the brain and surrounding structures were obtained without intravenous contrast. COMPARISON:  Head CT and CTA 08/14/2023 FINDINGS: Brain: There is a 1.5 cm acute infarct involving the posterior limb of the left internal capsule, corresponding to the asymmetric hypodensity described on today's CT. Scattered punctate T2 hyperintensities elsewhere in the cerebral white matter bilaterally are nonspecific but compatible with minimal chronic small vessel ischemic disease. Cerebral volume is within normal limits for age with normal size of the ventricles. No intracranial hemorrhage, mass, midline shift, or extra-axial fluid collection is identified. Vascular: Major intracranial vascular flow voids are preserved. Skull and upper cervical spine: Unremarkable bone marrow signal. Sinuses/Orbits: Unremarkable orbits. Moderate mucosal thickening in the paranasal sinuses. Clear mastoid air cells. Other: None. IMPRESSION: Acute left internal capsule infarct. Electronically Signed   By: Dasie Hamburg M.D.   On: 08/14/2023 13:17   CT  ANGIO HEAD NECK W WO CM Result Date: 08/14/2023 CLINICAL DATA:  Neuro deficit with acute stroke suspected EXAM: CT ANGIOGRAPHY HEAD AND NECK WITH AND WITHOUT CONTRAST TECHNIQUE: Multidetector CT imaging of the head and neck was performed using the standard protocol during bolus administration of intravenous contrast. Multiplanar CT image reconstructions and MIPs were obtained to evaluate the vascular anatomy. Carotid stenosis measurements (when applicable) are obtained utilizing NASCET criteria, using the distal internal carotid diameter as the denominator. RADIATION DOSE REDUCTION: This exam was performed according to the departmental dose-optimization program which includes automated exposure control, adjustment of the mA and/or kV according to patient size and/or use of iterative reconstruction technique. CONTRAST:  75mL OMNIPAQUE  IOHEXOL  350 MG/ML SOLN COMPARISON:  Head CT from earlier today. FINDINGS: CTA NECK FINDINGS Aortic arch: Unremarkable with 3 vessel branching Right carotid system: Atheromatous wall thickening of the common internal carotid arteries. Mixed density plaque accentuated at the bifurcation without significant stenosis and no ulceration. No beading or dissection. Left carotid system: Atheromatous wall thickening of the common carotid with accentuated mixed density plaque at the bifurcation. No stenosis or ulceration. Vertebral arteries: The vertebral arteries are smoothly contoured and diffusely patent. No proximal subclavian stenosis. Skeleton: Unremarkable Other neck: Patchy bilateral paranasal sinus opacification, incidental to the history. Upper chest: No  acute finding Review of the MIP images confirms the above findings CTA HEAD FINDINGS Anterior circulation: No significant stenosis, proximal occlusion, aneurysm, or vascular malformation. Generalized atheromatous type irregularity of medium size branches. Posterior circulation: Mild atheromatous irregularity of the posterior cerebral  artery especially on the right where there is a mild-to-moderate P3 stenosis. Venous sinuses: Unremarkable Anatomic variants: None significant Review of the MIP images confirms the above findings IMPRESSION: 1. No emergent finding. 2. Cervical and intracranial atherosclerosis without flow reducing stenosis or ulceration of major arteries in the head and neck. Electronically Signed   By: Dorn Roulette M.D.   On: 08/14/2023 11:28   CT HEAD WO CONTRAST Result Date: 08/14/2023 CLINICAL DATA:  Neuro deficit, acute, stroke suspected. Slurred speech and right-sided weakness. EXAM: CT HEAD WITHOUT CONTRAST TECHNIQUE: Contiguous axial images were obtained from the base of the skull through the vertex without intravenous contrast. RADIATION DOSE REDUCTION: This exam was performed according to the departmental dose-optimization program which includes automated exposure control, adjustment of the mA and/or kV according to patient size and/or use of iterative reconstruction technique. COMPARISON:  None Available. FINDINGS: Brain: No acute hemorrhage. Possible asymmetric hypoattenuation in the posterior limb of the left internal capsule (axial image 18 series 2). Cortical gray-white differentiation is preserved. No hydrocephalus or extra-axial collection. No mass effect or midline shift. Vascular: No hyperdense vessel or unexpected calcification. Skull: No calvarial fracture or suspicious bone lesion. Skull base is unremarkable. Sinuses/Orbits: Mild pansinus disease.  Orbits are unremarkable. Other: None. IMPRESSION: 1. Possible asymmetric hypoattenuation in the posterior limb of the left internal capsule, which could represent an age-indeterminate infarct. Consider MRI for further evaluation. 2. No acute hemorrhage. Electronically Signed   By: Bhargav Barbaro Chess M.D.   On: 08/14/2023 09:23          Additional notes: Initial Appointment Goals:  This initial visit focused on establishing a foundation for the patient's  care. We collaboratively reviewed his medical history and medications in detail, updating the chart as shown in the encounter. Given the extensive information, we prioritized addressing his most pressing concerns, which he reported were: New Patient (Initial Visit) (Recently had a stroke on 08/13/23.)  While the complexity of the patient's medical picture may necessitate further evaluation in subsequent visits, we were able to develop a preliminary care plan together. To expedite a comprehensive plan at the next visit, we encouraged the patient to gather relevant medical records from previous providers. This collaborative approach will ensure a more complete understanding of the patient's health and inform the development of a personalized care plan. We look forward to continuing the conversation and working together with the patient on achieving his health goals.   Collaborative Documentation:  Today's encounter utilized real-time, dynamic patient engagement.  Patients actively participate by directly reviewing and assisting in updating their medical records through a shared screen. This transparency empowers patients to visually confirm chart updates made by the healthcare provider.  This collaborative approach facilitates problem management as we jointly update the problem list, problem overview, and assessment/plan. Ultimately, this process enhances chart accuracy and completeness, fostering shared decision-making, patient education, and informed consent for tests and treatments.  Collaborative Treatment Planning:  Treatment plans were discussed and reviewed in detail.  Explained medication safety and potential side effects.  Encouraged participation and answered all patient questions, confirming understanding and comfort with the plan. Encouraged patient to contact our office if they have any questions or concerns. Agreed on patient returning to office if symptoms worsen,  persist, or new symptoms develop.  Discussed precautions in case of needing to visit the Emergency Department.  ----------------------------------------------------- Bernardino KANDICE Cone, MD  08/19/2023 6:38 PM  [facility]:  [phone]

## 2023-08-20 LAB — MICROALBUMIN / CREATININE URINE RATIO
Creatinine,U: 199.9 mg/dL
Microalb Creat Ratio: 1.4 mg/g (ref 0.0–30.0)
Microalb, Ur: 2.9 mg/dL — ABNORMAL HIGH (ref 0.0–1.9)

## 2023-08-24 ENCOUNTER — Encounter: Payer: Self-pay | Admitting: Internal Medicine

## 2023-08-24 DIAGNOSIS — E1121 Type 2 diabetes mellitus with diabetic nephropathy: Secondary | ICD-10-CM | POA: Insufficient documentation

## 2023-08-24 HISTORY — DX: Type 2 diabetes mellitus with diabetic nephropathy: E11.21

## 2023-08-24 NOTE — Progress Notes (Signed)
 Reviewed urine microalbumin test showing early signs of diabetes-related kidney stress. Combined with uncontrolled blood pressure and recent stroke, strongly indicates need for blood pressure medication (losartan ) to protect both kidneys and brain. Will discuss at upcoming visit. Detailed explanation sent via Patient Message.

## 2023-08-26 NOTE — Telephone Encounter (Signed)
 Left vm informing patient that I will be mailing lab results/notes.

## 2023-08-27 NOTE — Progress Notes (Deleted)
  Cardiology Office Note:  .   Date:  08/27/2023  ID:  Michael Ritter, DOB 09-23-1965, MRN [id number] PCP: Lula Olszewski, MD  [facility] Providers Cardiologist:  None { Click to update primary MD,subspecialty MD or APP then REFRESH:1}   History of Present Illness: .   Michael Ritter is a 58 y.o. male with hx of DM2, smoker, recent CVA 08/15/2023.I did not see this patient int he hospital, they were sent to my clinic for TEE and cardiac monitor. So far his findings were significant for left internal capsule infarction. Echo demonstrated a normal EF with a negative bubble study.  ROS: ***  Studies Reviewed: .        *** Risk Assessment/Calculations:   {Does this patient have ATRIAL FIBRILLATION?:[id number]} No BP recorded.  {Refresh Note OR Click here to enter BP  :1}***       Physical Exam:   VS:  There were no vitals taken for this visit.   Wt Readings from Last 3 Encounters:  08/19/23 289 lb 12.8 oz (131.5 kg)  03/16/14 262 lb (118.8 kg)    GEN: Well nourished, well developed in no acute distress NECK: No JVD; No carotid bruits CARDIAC: ***RRR, no murmurs, rubs, gallops RESPIRATORY:  Clear to auscultation without rales, wheezing or rhonchi  ABDOMEN: Soft, non-tender, non-distended EXTREMITIES:  No edema; No deformity   ASSESSMENT AND PLAN: .   CVA Concern for embolism TEE and 30 day cardiac monitor Lipid and anti-platelet management per neurology   A1c goal < 7 per PCP  {Are you ordering a CV Procedure (e.g. stress test, cath, DCCV, TEE, etc)?   Press F2        :[id number]}  Dispo: ***  Signed, Maisie Fus, MD

## 2023-08-28 ENCOUNTER — Ambulatory Visit: Payer: Commercial Managed Care - PPO | Admitting: Internal Medicine

## 2023-09-02 ENCOUNTER — Other Ambulatory Visit (HOSPITAL_COMMUNITY): Payer: Self-pay

## 2023-09-05 ENCOUNTER — Telehealth: Payer: Self-pay | Admitting: Internal Medicine

## 2023-09-05 ENCOUNTER — Other Ambulatory Visit: Payer: Self-pay | Admitting: Internal Medicine

## 2023-09-05 ENCOUNTER — Other Ambulatory Visit (HOSPITAL_COMMUNITY): Payer: Self-pay

## 2023-09-05 DIAGNOSIS — I639 Cerebral infarction, unspecified: Secondary | ICD-10-CM

## 2023-09-05 NOTE — Telephone Encounter (Signed)
Copied from CRM [id number]. Topic: Clinical - Prescription Issue >> Sep 04, 2023  4:49 PM Denese Killings wrote: Reason for CRM: Patient is still waiting a callback from Tiffany in regards to Blood thinner medication. He states that he has one left.  Patient ordered medicine and it will take 7-10 business days to arrive and he is checking to see if she was able to assist . He is concerned that he won't have any after tomorrow.

## 2023-09-05 NOTE — Telephone Encounter (Signed)
Pt advised that he should avoid missing his blood thinning medication. Pt states that he does not have enough of the medication at this time. States that the mail pharm that he utilizes will not get him his next refill until 7-10 days, pt is out of the medication starting tomorrow. Pt understands the risk of skipping doses of the blood thinner.

## 2023-09-05 NOTE — Telephone Encounter (Signed)
Patient is requesting call back from clinical staff . States he wants to discuss blood thinner medication . Please advise

## 2023-09-05 NOTE — Telephone Encounter (Unsigned)
Copied from CRM [id number]. Topic: Clinical - Medication Question >> Sep 05, 2023  3:22 PM Alvino Blood C wrote: Reason for CRM: Patient states he is running out of blood thinner medication. He wants to know would it be ok to go without the medication for 10 days? He's requesting a call back at: [phone] (M)

## 2023-09-08 ENCOUNTER — Other Ambulatory Visit: Payer: Self-pay

## 2023-09-08 ENCOUNTER — Other Ambulatory Visit (HOSPITAL_COMMUNITY): Payer: Self-pay

## 2023-09-08 DIAGNOSIS — I639 Cerebral infarction, unspecified: Secondary | ICD-10-CM

## 2023-09-08 MED ORDER — CLOPIDOGREL BISULFATE 75 MG PO TABS
75.0000 mg | ORAL_TABLET | Freq: Every day | ORAL | 0 refills | Status: AC
  Filled 2023-09-08: qty 10, 10d supply, fill #0

## 2023-09-09 ENCOUNTER — Other Ambulatory Visit (HOSPITAL_COMMUNITY): Payer: Self-pay

## 2023-09-11 NOTE — Telephone Encounter (Signed)
Message was sent previously.

## 2023-09-11 NOTE — Telephone Encounter (Signed)
Sent my chart message to patient concerning this medication.

## 2023-09-22 ENCOUNTER — Encounter: Payer: Self-pay | Admitting: Internal Medicine

## 2023-09-22 ENCOUNTER — Other Ambulatory Visit (HOSPITAL_COMMUNITY): Payer: Self-pay

## 2023-09-22 ENCOUNTER — Ambulatory Visit: Payer: Self-pay | Admitting: Internal Medicine

## 2023-09-22 VITALS — BP 163/101 | HR 83 | Temp 99.6°F | Ht 75.0 in | Wt 274.8 lb

## 2023-09-22 DIAGNOSIS — I10 Essential (primary) hypertension: Secondary | ICD-10-CM

## 2023-09-22 DIAGNOSIS — F17210 Nicotine dependence, cigarettes, uncomplicated: Secondary | ICD-10-CM

## 2023-09-22 DIAGNOSIS — I6930 Unspecified sequelae of cerebral infarction: Secondary | ICD-10-CM

## 2023-09-22 DIAGNOSIS — R030 Elevated blood-pressure reading, without diagnosis of hypertension: Secondary | ICD-10-CM

## 2023-09-22 DIAGNOSIS — E119 Type 2 diabetes mellitus without complications: Secondary | ICD-10-CM

## 2023-09-22 DIAGNOSIS — B351 Tinea unguium: Secondary | ICD-10-CM

## 2023-09-22 DIAGNOSIS — E65 Localized adiposity: Secondary | ICD-10-CM

## 2023-09-22 DIAGNOSIS — B353 Tinea pedis: Secondary | ICD-10-CM

## 2023-09-22 DIAGNOSIS — E785 Hyperlipidemia, unspecified: Secondary | ICD-10-CM

## 2023-09-22 MED ORDER — LOSARTAN POTASSIUM 25 MG PO TABS
25.0000 mg | ORAL_TABLET | Freq: Every day | ORAL | 3 refills | Status: DC
  Filled 2023-09-22: qty 30, 30d supply, fill #0
  Filled 2023-11-22: qty 30, 30d supply, fill #1
  Filled 2024-01-17 (×2): qty 60, 60d supply, fill #2

## 2023-09-22 MED ORDER — BUPROPION HCL ER (SR) 100 MG PO TB12
100.0000 mg | ORAL_TABLET | Freq: Every morning | ORAL | 3 refills | Status: AC
  Filled 2023-09-22: qty 30, 30d supply, fill #0

## 2023-09-22 MED ORDER — TERBINAFINE HCL 250 MG PO TABS
250.0000 mg | ORAL_TABLET | Freq: Every day | ORAL | 0 refills | Status: AC
Start: 2023-09-22 — End: 2023-12-15
  Filled 2023-09-22: qty 30, 30d supply, fill #0

## 2023-09-22 MED ORDER — CICLOPIROX OLAMINE 0.77 % EX CREA
TOPICAL_CREAM | Freq: Two times a day (BID) | CUTANEOUS | 3 refills | Status: AC
  Filled 2023-09-22: qty 30, 30d supply, fill #0

## 2023-09-22 NOTE — Progress Notes (Signed)
 ==============================  [facility]: [phone]   -- Medical Office Visit --  Patient: Michael Ritter      Age: 58 y.o.       Sex:  male  Date:   09/22/2023 Today's Healthcare Provider: Lula Olszewski, MD  ==============================   CHIEF COMPLAINT: 1 month follow-up, Diabetes, Hypertension, and recent stroke  SUBJECTIVE: Background This is a 58 y.o. male who has Thyroid nodule; Acute CVA (cerebrovascular accident) (HCC); Cigarette smoker; Elevated blood pressure reading; DM type 2 (diabetes mellitus, type 2) (HCC); and Diabetic nephropathy (HCC) on their problem list.  History of Present Illness Michael Ritter is a 58 year old male with diabetes and hypertension who presents for a follow-up visit.  He feels nervous during doctor visits, which he believes affects his blood pressure readings. He has not yet started exercising but plans to do so once he feels more stable. He has been actively managing his diet by significantly reducing sodium and sugar intake, avoiding cheese and junk food, and focusing on chicken, yogurt, fruit, and broccoli. He has lost 11 to 12 pounds recently.  He has a history of a stroke and is on blood thinners and cholesterol medication. He experienced a lapse in his blood thinner medication due to a prescription refill issue, going four days without it. He is concerned about the risk of another stroke due to this lapse. He describes some cognitive changes, such as difficulty with typing and occasional forgetfulness. He has returned to work as a Merchandiser, retail but finds himself more irritable and less tolerant of others' actions, which he attributes to the stroke. He reports changes in his vision post-stroke, noting improved distance vision but persistent issues with close-up vision.  During a foot exam, he was found to have a fungal infection in his toenail and athlete's foot between his toes. He has been using  antifungal cream inconsistently and acknowledges the need for daily application. He wears black socks, which he learned can contribute to moisture retention and fungal growth.  He is currently taking omega-3 supplements for cholesterol management and has been using beetroot for blood pressure control. He has not been on any prescribed blood pressure medication yet.  He is trying to quit smoking and has reduced his intake to one pack every three days. He has tried nicotine lozenges but found them ineffective at the low dose he used. Social History - Patient smoking 1 pack every 3 days  Reviewed chart records that patient  has a past medical history of Acute CVA (cerebrovascular accident) (HCC) (08/14/2023), Cigarette smoker (08/15/2023), Diabetic nephropathy (HCC) (08/24/2023), DM type 2 (diabetes mellitus, type 2) (HCC) (08/15/2023), Elevated blood pressure reading (08/15/2023), Hyperlipidemia, Hypertension, Hypoxia (08/15/2023), Thyroid nodule (03/16/2014), and Urinary tract infection. Discussed Past Medical History - History of diabetes - History of stroke  Today's Verbally Confirmed Medications - Beetroot - Blood thinners dual anti-platelet therapy (DAPT)  - Cholesterol medication Current Outpatient Medications on File Prior to Visit  Medication Sig   aspirin 81 MG chewable tablet Chew 1 tablet (81 mg total) by mouth daily.   atorvastatin (LIPITOR) 80 MG tablet Take 1 tablet (80 mg total) by mouth daily.   clopidogrel (PLAVIX) 75 MG tablet Take 1 tablet (75 mg total) by mouth daily.   clopidogrel (PLAVIX) 75 MG tablet Take 1 tablet (75 mg total) by mouth daily. Emergency prescription.   ibuprofen (ADVIL) 200 MG tablet Take 4 tablets (800 mg total) by mouth every 6 (  six) hours as needed for moderate pain (pain score 4-6).   Multiple Vitamin (MULTIVITAMIN WITH MINERALS) TABS tablet Take 1 tablet by mouth daily.   Semaglutide,0.25 or 0.5MG /DOS, (OZEMPIC, 0.25 OR 0.5 MG/DOSE,) 2 MG/3ML SOPN  Inject 0.25 mg into the skin once a week.   No current facility-administered medications on file prior to visit.  There are no discontinued medications.    Objective   Physical Exam     09/22/2023    4:02 PM 09/22/2023    3:57 PM 08/19/2023   10:17 AM  Vitals with BMI  Height  6\' 3"    Weight  274 lbs 13 oz   BMI  34.35   Systolic 163 158 119  Diastolic 101 94 90  Pulse  83    Wt Readings from Last 10 Encounters:  09/22/23 274 lb 12.8 oz (124.6 kg)  08/19/23 289 lb 12.8 oz (131.5 kg)  03/16/14 262 lb (118.8 kg)   Vital signs reviewed.  Nursing notes reviewed. Weight trend reviewed. Abnormalities and Problem-Specific physical exam findings:  truncal adiposity, slurred speech unchanged Diabetic Foot Exam - Simple   Simple Foot Form Diabetic Foot exam was performed with the following findings: Yes 09/22/2023  4:00 PM  Visual Inspection No deformities, no ulcerations, no other skin breakdown bilaterally: Yes Sensation Testing Intact to touch and monofilament testing bilaterally: Yes Pulse Check Posterior Tibialis and Dorsalis pulse intact bilaterally: Yes Comments CARDIOVASCULAR: Great circulation observed. EXTREMITIES: No skin breakdown observed. NEUROLOGICAL: Intact sensation to touch on feet. SKIN:  athlete's foot infection in the space between toes. Toenail on the both great toe with fungal infection. Possible additional fungal infection indicated by pale white areas on the feet.      General Appearance:  No acute distress appreciable.   Well-groomed, healthy-appearing male.  Well proportioned with no abnormal fat distribution.  Good muscle tone. Pulmonary:  Normal work of breathing at rest, no respiratory distress apparent. SpO2: 95 %  Musculoskeletal: All extremities are intact.  Neurological:  Awake, alert, oriented, and engaged.  No obvious focal neurological deficits or cognitive impairments.  Sensorium seems unclouded.   Speech is clear and coherent with logical  content. Psychiatric:  Appropriate mood, pleasant and cooperative demeanor, thoughtful and engaged during the exam    No results found for any visits on 09/22/23. Office Visit on 08/19/2023  Component Date Value   Microalb, Ur 08/19/2023 2.9 (H)    Creatinine,U 08/19/2023 199.9    Microalb Creat Ratio 08/19/2023 1.4   Admission on 08/14/2023, Discharged on 08/15/2023  Component Date Value   Glucose-Capillary 08/14/2023 169 (H)    Prothrombin Time 08/14/2023 12.8    INR 08/14/2023 0.9    aPTT 08/14/2023 24    WBC 08/14/2023 10.0    RBC 08/14/2023 5.54    Hemoglobin 08/14/2023 16.8    HCT 08/14/2023 50.5    MCV 08/14/2023 91.2    MCH 08/14/2023 30.3    MCHC 08/14/2023 33.3    RDW 08/14/2023 13.5    Platelets 08/14/2023 226    nRBC 08/14/2023 0.0    Neutrophils Relative % 08/14/2023 59    Neutro Abs 08/14/2023 6.1    Lymphocytes Relative 08/14/2023 29    Lymphs Abs 08/14/2023 2.9    Monocytes Relative 08/14/2023 7    Monocytes Absolute 08/14/2023 0.7    Eosinophils Relative 08/14/2023 3    Eosinophils Absolute 08/14/2023 0.3    Basophils Relative 08/14/2023 1    Basophils Absolute 08/14/2023 0.1    Immature Granulocytes  08/14/2023 1    Abs Immature Granulocytes 08/14/2023 0.05    Sodium 08/14/2023 135    Potassium 08/14/2023 3.8    Chloride 08/14/2023 103    CO2 08/14/2023 24    Glucose, Bld 08/14/2023 171 (H)    BUN 08/14/2023 15    Creatinine, Ser 08/14/2023 0.96    Calcium 08/14/2023 9.5    Total Protein 08/14/2023 8.2 (H)    Albumin 08/14/2023 4.4    AST 08/14/2023 30    ALT 08/14/2023 35    Alkaline Phosphatase 08/14/2023 68    Total Bilirubin 08/14/2023 0.7    GFR, Estimated 08/14/2023 >60    Anion gap 08/14/2023 8    Alcohol, Ethyl (B) 08/14/2023 <10    Sodium 08/14/2023 141    Potassium 08/14/2023 4.0    Chloride 08/14/2023 104    BUN 08/14/2023 14    Creatinine, Ser 08/14/2023 1.00    Glucose, Bld 08/14/2023 166 (H)    Calcium, Ion 08/14/2023 1.18     TCO2 08/14/2023 24    Hemoglobin 08/14/2023 16.0    HCT 08/14/2023 47.0    Cholesterol 08/14/2023 208 (H)    Triglycerides 08/14/2023 106    HDL 08/14/2023 46    Total CHOL/HDL Ratio 08/14/2023 4.5    VLDL 08/14/2023 21    LDL Cholesterol 08/14/2023 141 (H)    Hgb A1c MFr Bld 08/14/2023 8.0 (H)    Mean Plasma Glucose 08/14/2023 183    HIV Screen 4th Generatio* 08/15/2023 Non Reactive    BP 08/14/2023 178/93    Single Plane A2C EF 08/14/2023 53.1    Single Plane A4C EF 08/14/2023 61.0    Calc EF 08/14/2023 57.8    S' Lateral 08/14/2023 3.50    Area-P 1/2 08/14/2023 4.68    Est EF 08/14/2023 55   No image results found. ECHOCARDIOGRAM COMPLETE Result Date: 08/14/2023    ECHOCARDIOGRAM REPORT   Patient Name:   JAIVION KINGSLEY Date of Exam: 08/14/2023 Medical Rec #:  [id number]       Height:       75.0 in Accession #:    [id number]      Weight:       262.0 lb Date of Birth:  1966-04-07      BSA:          2.461 m Patient Age:    57 years        BP:           180/95 mmHg Patient Gender: M               HR:           78 bpm. Exam Location:  Inpatient Procedure: 2D Echo, Cardiac Doppler, Color Doppler, Saline Contrast Bubble Study            and Intracardiac Opacification Agent Indications:    CVA  History:        Patient has no prior history of Echocardiogram examinations.                 Risk Factors:Current Smoker.  Sonographer:    Webb Laws Referring Phys: [id number] MIR M IKRAMULLAH IMPRESSIONS  1. Left ventricular ejection fraction, by estimation, is 55%. The left ventricle has normal function. The left ventricle has no regional wall motion abnormalities. There is moderate concentric left ventricular hypertrophy. Left ventricular diastolic parameters are consistent with Grade I diastolic dysfunction (impaired relaxation).  2. The IVC was not visualized. Right ventricular systolic function is normal.  The right ventricular size is normal. Tricuspid regurgitation signal is inadequate for  assessing PA pressure.  3. The mitral valve is normal in structure. No evidence of mitral valve regurgitation. No evidence of mitral stenosis.  4. The aortic valve is tricuspid. Aortic valve regurgitation is not visualized. No aortic stenosis is present.  5. Aortic dilatation noted. There is mild dilatation of the ascending aorta, measuring 38 mm.  6. Bubble study was probably negative but images were poor.  7. Technically difficult study with poor acoustic windows. FINDINGS  Left Ventricle: Left ventricular ejection fraction, by estimation, is 55%. The left ventricle has normal function. The left ventricle has no regional wall motion abnormalities. Definity contrast agent was given IV to delineate the left ventricular endocardial borders. The left ventricular internal cavity size was normal in size. There is moderate concentric left ventricular hypertrophy. Left ventricular diastolic parameters are consistent with Grade I diastolic dysfunction (impaired relaxation). Right Ventricle: The IVC was not visualized. The right ventricular size is normal. Right vetricular wall thickness was not well visualized. Right ventricular systolic function is normal. Tricuspid regurgitation signal is inadequate for assessing PA pressure. Left Atrium: Left atrial size was normal in size. Right Atrium: Right atrial size was normal in size. Pericardium: There is no evidence of pericardial effusion. Mitral Valve: The mitral valve is normal in structure. No evidence of mitral valve regurgitation. No evidence of mitral valve stenosis. Tricuspid Valve: The tricuspid valve is normal in structure. Tricuspid valve regurgitation is trivial. Aortic Valve: The aortic valve is tricuspid. Aortic valve regurgitation is not visualized. No aortic stenosis is present. Pulmonic Valve: The pulmonic valve was normal in structure. Pulmonic valve regurgitation is not visualized. Aorta: The aortic root is normal in size and structure and aortic dilatation  noted. There is mild dilatation of the ascending aorta, measuring 38 mm. Venous: The inferior vena cava was not well visualized. IAS/Shunts: Bubble study was probably negative but images were poor. Agitated saline contrast was given intravenously to evaluate for intracardiac shunting.  LEFT VENTRICLE PLAX 2D LVIDd:         4.40 cm      Diastology LVIDs:         3.50 cm      LV e' medial:    5.87 cm/s LV PW:         1.50 cm      LV E/e' medial:  8.9 LV IVS:        1.60 cm      LV e' lateral:   5.11 cm/s LVOT diam:     2.50 cm      LV E/e' lateral: 10.3 LV SV:         97 LV SV Index:   39 LVOT Area:     4.91 cm  LV Volumes (MOD) LV vol d, MOD A2C: 125.0 ml LV vol d, MOD A4C: 144.0 ml LV vol s, MOD A2C: 58.6 ml LV vol s, MOD A4C: 56.1 ml LV SV MOD A2C:     66.4 ml LV SV MOD A4C:     144.0 ml LV SV MOD BP:      78.6 ml RIGHT VENTRICLE RV Basal diam:  3.40 cm RV S prime:     11.40 cm/s TAPSE (M-mode): 2.9 cm LEFT ATRIUM             Index        RIGHT ATRIUM           Index LA diam:  4.00 cm 1.63 cm/m   RA Area:     11.60 cm LA Vol (A2C):   44.1 ml 17.92 ml/m  RA Volume:   22.10 ml  8.98 ml/m LA Vol (A4C):   28.1 ml 11.42 ml/m LA Biplane Vol: 36.0 ml 14.63 ml/m  AORTIC VALVE LVOT Vmax:   106.00 cm/s LVOT Vmean:  72.300 cm/s LVOT VTI:    0.198 m  AORTA Ao Root diam: 3.40 cm Ao Asc diam:  3.80 cm MITRAL VALVE MV Area (PHT): 4.68 cm    SHUNTS MV Decel Time: 162 msec    Systemic VTI:  0.20 m MV E velocity: 52.40 cm/s  Systemic Diam: 2.50 cm MV A velocity: 99.00 cm/s MV E/A ratio:  0.53 Dalton McleanMD Electronically signed by Wilfred Lacy Signature Date/Time: 08/14/2023/5:38:42 PM    Final    MR BRAIN WO CONTRAST Result Date: 08/14/2023 CLINICAL DATA:  Neuro deficit, acute, stroke suspected. Slurred speech and right-sided weakness. Headache. EXAM: MRI HEAD WITHOUT CONTRAST TECHNIQUE: Multiplanar, multiecho pulse sequences of the brain and surrounding structures were obtained without intravenous contrast.  COMPARISON:  Head CT and CTA 08/14/2023 FINDINGS: Brain: There is a 1.5 cm acute infarct involving the posterior limb of the left internal capsule, corresponding to the asymmetric hypodensity described on today's CT. Scattered punctate T2 hyperintensities elsewhere in the cerebral white matter bilaterally are nonspecific but compatible with minimal chronic small vessel ischemic disease. Cerebral volume is within normal limits for age with normal size of the ventricles. No intracranial hemorrhage, mass, midline shift, or extra-axial fluid collection is identified. Vascular: Major intracranial vascular flow voids are preserved. Skull and upper cervical spine: Unremarkable bone marrow signal. Sinuses/Orbits: Unremarkable orbits. Moderate mucosal thickening in the paranasal sinuses. Clear mastoid air cells. Other: None. IMPRESSION: Acute left internal capsule infarct. Electronically Signed   By: Sebastian Ache M.D.   On: 08/14/2023 13:17   CT ANGIO HEAD NECK W WO CM Result Date: 08/14/2023 CLINICAL DATA:  Neuro deficit with acute stroke suspected EXAM: CT ANGIOGRAPHY HEAD AND NECK WITH AND WITHOUT CONTRAST TECHNIQUE: Multidetector CT imaging of the head and neck was performed using the standard protocol during bolus administration of intravenous contrast. Multiplanar CT image reconstructions and MIPs were obtained to evaluate the vascular anatomy. Carotid stenosis measurements (when applicable) are obtained utilizing NASCET criteria, using the distal internal carotid diameter as the denominator. RADIATION DOSE REDUCTION: This exam was performed according to the departmental dose-optimization program which includes automated exposure control, adjustment of the mA and/or kV according to patient size and/or use of iterative reconstruction technique. CONTRAST:  75mL OMNIPAQUE IOHEXOL 350 MG/ML SOLN COMPARISON:  Head CT from earlier today. FINDINGS: CTA NECK FINDINGS Aortic arch: Unremarkable with 3 vessel branching Right  carotid system: Atheromatous wall thickening of the common internal carotid arteries. Mixed density plaque accentuated at the bifurcation without significant stenosis and no ulceration. No beading or dissection. Left carotid system: Atheromatous wall thickening of the common carotid with accentuated mixed density plaque at the bifurcation. No stenosis or ulceration. Vertebral arteries: The vertebral arteries are smoothly contoured and diffusely patent. No proximal subclavian stenosis. Skeleton: Unremarkable Other neck: Patchy bilateral paranasal sinus opacification, incidental to the history. Upper chest: No acute finding Review of the MIP images confirms the above findings CTA HEAD FINDINGS Anterior circulation: No significant stenosis, proximal occlusion, aneurysm, or vascular malformation. Generalized atheromatous type irregularity of medium size branches. Posterior circulation: Mild atheromatous irregularity of the posterior cerebral artery especially on the right where there is  a mild-to-moderate P3 stenosis. Venous sinuses: Unremarkable Anatomic variants: None significant Review of the MIP images confirms the above findings IMPRESSION: 1. No emergent finding. 2. Cervical and intracranial atherosclerosis without flow reducing stenosis or ulceration of major arteries in the head and neck. Electronically Signed   By: Tiburcio Pea M.D.   On: 08/14/2023 11:28   CT HEAD WO CONTRAST Result Date: 08/14/2023 CLINICAL DATA:  Neuro deficit, acute, stroke suspected. Slurred speech and right-sided weakness. EXAM: CT HEAD WITHOUT CONTRAST TECHNIQUE: Contiguous axial images were obtained from the base of the skull through the vertex without intravenous contrast. RADIATION DOSE REDUCTION: This exam was performed according to the departmental dose-optimization program which includes automated exposure control, adjustment of the mA and/or kV according to patient size and/or use of iterative reconstruction technique.  COMPARISON:  None Available. FINDINGS: Brain: No acute hemorrhage. Possible asymmetric hypoattenuation in the posterior limb of the left internal capsule (axial image 18 series 2). Cortical gray-white differentiation is preserved. No hydrocephalus or extra-axial collection. No mass effect or midline shift. Vascular: No hyperdense vessel or unexpected calcification. Skull: No calvarial fracture or suspicious bone lesion. Skull base is unremarkable. Sinuses/Orbits: Mild pansinus disease.  Orbits are unremarkable. Other: None. IMPRESSION: 1. Possible asymmetric hypoattenuation in the posterior limb of the left internal capsule, which could represent an age-indeterminate infarct. Consider MRI for further evaluation. 2. No acute hemorrhage. Electronically Signed   By: Orvan Falconer M.D.   On: 08/14/2023 09:23  ECHOCARDIOGRAM COMPLETE    Assessment & Plan History of stroke with residual effects Stroke 08/2023 Currently on dual anti-platelet therapy (DAPT) . Emphasize adherence to prevent another stroke, with a 50% risk reduction explained. Stress from pharmacy issues was addressed. Ensure blood thinners are refilled for one year, discuss the importance of medication adherence, and encourage reporting any issues with medication refills immediately. Type 2 diabetes mellitus without complication, without long-term current use of insulin (HCC) Type 2 Diabetes Mellitus The importance of foot and eye exams to prevent complications was discussed. A fungal infection in toenails and athlete's foot was noted. Emphasize daily antifungal cream application and regular foot exams. Perform a foot exam, refer for an annual diabetic eye exam, prescribe terbinafine for the fungal infection, and prescribe antifungal cream for athlete's foot. Educate on daily use of antifungal cream and the importance of foot care. Tinea pedis of both feet Terbinafine/ciclopirox prescribed and appropriate use  discussed Onychomycosis Terbinafine/ciclopirox prescribed and appropriate use discussed Hypertension, unspecified type Hypertension Blood pressure is elevated, with anxiety reported during visits and no home monitoring. Home monitoring and weight loss benefits were discussed. Losartan's kidney protection and minimal side effects were explained. Prescribe low-dose losartan, encourage home blood pressure monitoring, and follow up in 3-4 weeks for a blood pressure check. Cigarette smoker Encouraged patient to reduce smoking.  Smoking Cessation Smoking has been reduced but not quit. Discussed benefits of quitting and aids like Wellbutrin and nicotine lozenges (Zin). Recommend Wellbutrin for smoking cessation and weight loss, and discuss the use of nicotine lozenges. Hyperlipidemia, unspecified hyperlipidemia type Hyperlipidemia Currently on cholesterol medication. Continue medication to prevent cardiovascular events and consider omega-3 supplements. Ensure cholesterol medication is refilled for one year.0 Medications: not yet discussed Lab Results  Component Value Date   HDL 46 08/14/2023   CHOLHDL 4.5 08/14/2023   Lab Results  Component Value Date   LDLCALC 141 (H) 08/14/2023   Lab Results  Component Value Date   TRIG 106 08/14/2023   Lab Results  Component Value Date  CHOL 208 (H) 08/14/2023   The ASCVD Risk score (Arnett DK, et al., 2019) failed to calculate for the following reasons:   Risk score cannot be calculated because patient has a medical history suggesting prior/existing ASCVD Lab Results  Component Value Date   ALT 35 08/14/2023   AST 30 08/14/2023   ALKPHOS 68 08/14/2023   HGBA1C 8.0 (H) 08/14/2023   Body mass index is 34.35 kg/m.  Lipoprotein(a), Apolipoprotein B (ApoB), and High-sensitivity C-reactive protein (hs-CRP) No results found for: "HSCRP", "LIPOA" Improving Your Cholesterol: Diet: Focus on a Mediterranean-style diet, limit saturated fats and sugars,  and increase omega-3 fatty acids (fish, flaxseeds,nuts,extra virgin olive oil, avocados). Exercise: Engage in regular physical activity (aerobic exercises are particularly beneficial for HDL). Weight Management: Maintain a healthy weight. Smoking Cessation: Quitting smoking improves cholesterol levels.  Central adiposity Obesity Lost 11-12 pounds through dietary changes. Discussed health benefits of weight loss. Interested in weight loss medication but concerned about cost. Explained Wegovy or Ozempic's efficacy and insurance coverage. Refer to [facility] pharmacist for weight loss medication, continue current dietary changes, and encourage physical activity as tolerated.  General Health Maintenance The importance of regular health maintenance, including cancer screenings and annual physical exams, was discussed. Schedule an annual physical exam and perform cancer screenings as part of the exam.  Follow-up Follow up in 3-4 weeks for a blood pressure check, schedule an annual physical exam, and ensure all medications are refilled for one year.     Orders Placed During this Encounter:   Orders Placed This Encounter  Procedures   AMB Referral [facility]BCI Care Management    Referral Priority:   Routine    Referral Type:   Consultation    Referral Reason:   Care Coordination    Number of Visits Requested:   1   Meds ordered this encounter  Medications   ciclopirox (LOPROX) 0.77 % cream    Sig: Apply topically 2 (two) times daily.    Dispense:  90 g    Refill:  3   terbinafine (LAMISIL) 250 MG tablet    Sig: Take 1 tablet (250 mg total) by mouth daily. For toenail fungal infection    Dispense:  84 tablet    Refill:  0   losartan (COZAAR) 25 MG tablet    Sig: Take 1 tablet (25 mg total) by mouth daily.    Dispense:  90 tablet    Refill:  3   buPROPion ER (WELLBUTRIN SR) 100 MG 12 hr tablet    Sig: Take 1 tablet (100 mg total) by mouth in the morning.    Dispense:  90 tablet    Refill:  3        This document was synthesized by artificial intelligence ([employer]bridge) using HIPAA-compliant recording of the clinical interaction;   We discussed the use of AI scribe software for clinical note transcription with the patient, who gave verbal consent to proceed.    Additional Info: This encounter employed state-of-the-art, real-time, collaborative documentation. The patient actively reviewed and assisted in updating their electronic medical record on a shared screen, ensuring transparency and facilitating joint problem-solving for the problem list, overview, and plan. This approach promotes accurate, informed care. The treatment plan was discussed and reviewed in detail, including medication safety, potential side effects, and all patient questions. We confirmed understanding and comfort with the plan. Follow-up instructions were established, including contacting the office for any concerns, returning if symptoms worsen, persist, or new symptoms develop, and precautions for potential  emergency department visits.

## 2023-09-23 DIAGNOSIS — E65 Localized adiposity: Secondary | ICD-10-CM | POA: Insufficient documentation

## 2023-09-23 DIAGNOSIS — B351 Tinea unguium: Secondary | ICD-10-CM | POA: Insufficient documentation

## 2023-09-23 DIAGNOSIS — E785 Hyperlipidemia, unspecified: Secondary | ICD-10-CM | POA: Insufficient documentation

## 2023-09-23 NOTE — Assessment & Plan Note (Signed)
Hyperlipidemia Currently on cholesterol medication. Continue medication to prevent cardiovascular events and consider omega-3 supplements. Ensure cholesterol medication is refilled for one year.0 Medications: not yet discussed Lab Results  Component Value Date   HDL 46 08/14/2023   CHOLHDL 4.5 08/14/2023   Lab Results  Component Value Date   LDLCALC 141 (H) 08/14/2023   Lab Results  Component Value Date   TRIG 106 08/14/2023   Lab Results  Component Value Date   CHOL 208 (H) 08/14/2023   The ASCVD Risk score (Arnett DK, et al., 2019) failed to calculate for the following reasons:   Risk score cannot be calculated because patient has a medical history suggesting prior/existing ASCVD Lab Results  Component Value Date   ALT 35 08/14/2023   AST 30 08/14/2023   ALKPHOS 68 08/14/2023   HGBA1C 8.0 (H) 08/14/2023   Body mass index is 34.35 kg/m.  Lipoprotein(a), Apolipoprotein B (ApoB), and High-sensitivity C-reactive protein (hs-CRP) No results found for: "HSCRP", "LIPOA" Improving Your Cholesterol: Diet: Focus on a Mediterranean-style diet, limit saturated fats and sugars, and increase omega-3 fatty acids (fish, flaxseeds,nuts,extra virgin olive oil, avocados). Exercise: Engage in regular physical activity (aerobic exercises are particularly beneficial for HDL). Weight Management: Maintain a healthy weight. Smoking Cessation: Quitting smoking improves cholesterol levels.

## 2023-09-23 NOTE — Assessment & Plan Note (Signed)
Stroke 08/2023 Currently on dual anti-platelet therapy (DAPT) . Emphasize adherence to prevent another stroke, with a 50% risk reduction explained. Stress from pharmacy issues was addressed. Ensure blood thinners are refilled for one year, discuss the importance of medication adherence, and encourage reporting any issues with medication refills immediately.

## 2023-09-23 NOTE — Assessment & Plan Note (Signed)
Obesity Lost 11-12 pounds through dietary changes. Discussed health benefits of weight loss. Interested in weight loss medication but concerned about cost. Explained Wegovy or Ozempic's efficacy and insurance coverage. Refer to [facility] pharmacist for weight loss medication, continue current dietary changes, and encourage physical activity as tolerated.

## 2023-09-23 NOTE — Patient Instructions (Signed)
VISIT SUMMARY:  Today, we discussed your ongoing health concerns, including your diabetes, hypertension, history of stroke, and recent weight loss. We also addressed your smoking cessation efforts and fungal infection in your toenails and between your toes. Your current medications and the importance of adherence were reviewed, and we talked about the benefits of home blood pressure monitoring and regular health maintenance.  YOUR PLAN:  -HYPERTENSION: Hypertension, or high blood pressure, can lead to serious health problems if not managed. We discussed starting you on a low-dose medication called losartan, which helps protect your kidneys and has minimal side effects. You should also monitor your blood pressure at home and continue with your weight loss efforts. We will check your blood pressure again in 3-4 weeks.  -TYPE 2 DIABETES MELLITUS: Type 2 diabetes affects how your body processes blood sugar. We emphasized the importance of regular foot and eye exams to prevent complications. You have a fungal infection in your toenails and athlete's foot, so please apply the prescribed antifungal cream daily and take the terbinafine as directed. Regular foot care is crucial.  -HYPERLIPIDEMIA: Hyperlipidemia means you have high levels of fats in your blood, which can increase your risk of heart disease. Continue taking your cholesterol medication and consider omega-3 supplements. We have ensured your cholesterol medication is refilled for one year.  -STROKE: A stroke occurs when blood flow to a part of your brain is interrupted. You are on blood thinners to reduce the risk of another stroke by 50%. It is very important to take your blood thinners as prescribed and report any issues with refills immediately. We have ensured your blood thinners are refilled for one year.  -OBESITY: Obesity can lead to various health issues. You have lost 11-12 pounds through dietary changes, which is excellent. We discussed  weight loss medications like Wegovy or Ozempic and their insurance coverage. You will be referred to a pharmacist to discuss these options further. Keep up with your current diet and try to increase physical activity as you feel able.  -SMOKING CESSATION: Quitting smoking has numerous health benefits. You have reduced your smoking, which is a good start. We discussed using Wellbutrin and nicotine lozenges to help you quit completely. Wellbutrin can also aid in weight loss.  -GENERAL HEALTH MAINTENANCE: Regular health maintenance, including cancer screenings and annual physical exams, is important for overall well-being. We will schedule your annual physical exam and perform necessary cancer screenings during that visit.  INSTRUCTIONS:  Please follow up in 3-4 weeks for a blood pressure check. Schedule your annual physical exam and ensure all your medications are refilled for one year. Continue with your current health management plan and report any issues with medication refills immediately.

## 2023-09-23 NOTE — Assessment & Plan Note (Signed)
Type 2 Diabetes Mellitus The importance of foot and eye exams to prevent complications was discussed. A fungal infection in toenails and athlete's foot was noted. Emphasize daily antifungal cream application and regular foot exams. Perform a foot exam, refer for an annual diabetic eye exam, prescribe terbinafine for the fungal infection, and prescribe antifungal cream for athlete's foot. Educate on daily use of antifungal cream and the importance of foot care.

## 2023-09-23 NOTE — Assessment & Plan Note (Signed)
Encouraged patient to reduce smoking.  Smoking Cessation Smoking has been reduced but not quit. Discussed benefits of quitting and aids like Wellbutrin and nicotine lozenges (Zin). Recommend Wellbutrin for smoking cessation and weight loss, and discuss the use of nicotine lozenges.

## 2023-09-23 NOTE — Assessment & Plan Note (Signed)
Terbinafine/ciclopirox prescribed and appropriate use discussed

## 2023-09-23 NOTE — Assessment & Plan Note (Signed)
Hypertension Blood pressure is elevated, with anxiety reported during visits and no home monitoring. Home monitoring and weight loss benefits were discussed. Losartan's kidney protection and minimal side effects were explained. Prescribe low-dose losartan, encourage home blood pressure monitoring, and follow up in 3-4 weeks for a blood pressure check.

## 2023-09-30 ENCOUNTER — Telehealth: Payer: Self-pay

## 2023-09-30 NOTE — Progress Notes (Signed)
 Care Guide Pharmacy Note  09/30/2023 Name: Michael Ritter MRN: [id number] DOB: [birth year]  Referred By: Lula Olszewski, MD Reason for referral: Care Coordination (Outreach to schedule with Pharm d )   Michael Ritter is a 58 y.o. year old male who is a primary care patient of Lula Olszewski, MD.  [name] was referred to the pharmacist for assistance related to: DMII  An unsuccessful telephone outreach was attempted today to contact the patient who was referred to the pharmacy team for assistance with medication assistance. Additional attempts will be made to contact the patient.  Penne Lash , RMA     [facility]  [facility], [facility] Guide  Direct Dial: [phone]  Website: [email or web address]

## 2023-10-09 NOTE — Progress Notes (Signed)
 Care Guide Pharmacy Note  10/09/2023 Name: Michael Ritter MRN: [id number] DOB: 26-Nov-1965  Referred By: Lula Olszewski, MD Reason for referral: Care Coordination (Outreach to schedule with Pharm d )   [name] is a 58 y.o. year old male who is a primary care patient of Lula Olszewski, MD.  [name] was referred to the pharmacist for assistance related to: DMII  A second unsuccessful telephone outreach was attempted today to contact the patient who was referred to the pharmacy team for assistance with medication assistance. Additional attempts will be made to contact the patient.  Penne Lash , RMA     [facility]  [facility], [facility] Guide  Direct Dial: [phone]  Website: [email or web address]

## 2023-10-13 NOTE — Progress Notes (Signed)
 Care Guide Pharmacy Note  10/13/2023 Name: Michael Ritter MRN: [id number] DOB: [birth year]  Referred By: Lula Olszewski, MD Reason for referral: Care Coordination (Outreach to schedule with Pharm d )   [name] is a 58 y.o. year old male who is a primary care patient of Lula Olszewski, MD.  [name] was referred to the pharmacist for assistance related to: DMII  A third unsuccessful telephone outreach was attempted today to contact the patient who was referred to the pharmacy team for assistance with medication assistance. The Population Health team is pleased to engage with this patient at any time in the future upon receipt of referral and should he/she be interested in assistance from the [employer] [facility] team.  Penne Lash , RMA     [facility]  [facility], [facility] Guide  Direct Dial: [phone]  Website: [email or web address]

## 2023-10-28 ENCOUNTER — Ambulatory Visit: Payer: Commercial Managed Care - PPO | Admitting: Internal Medicine

## 2023-11-22 ENCOUNTER — Other Ambulatory Visit (HOSPITAL_COMMUNITY): Payer: Self-pay

## 2024-01-17 ENCOUNTER — Other Ambulatory Visit (HOSPITAL_COMMUNITY): Payer: Self-pay

## 2024-01-27 ENCOUNTER — Other Ambulatory Visit (HOSPITAL_COMMUNITY): Payer: Self-pay

## 2024-01-27 ENCOUNTER — Ambulatory Visit (INDEPENDENT_AMBULATORY_CARE_PROVIDER_SITE_OTHER): Payer: Private Health Insurance - Indemnity | Admitting: Internal Medicine

## 2024-01-27 ENCOUNTER — Encounter: Payer: Self-pay | Admitting: Internal Medicine

## 2024-01-27 ENCOUNTER — Ambulatory Visit: Payer: Self-pay | Admitting: Internal Medicine

## 2024-01-27 VITALS — BP 150/88 | HR 83 | Temp 98.0°F | Ht 75.0 in | Wt 253.8 lb

## 2024-01-27 DIAGNOSIS — Z5987 Material hardship due to limited financial resources, not elsewhere classified: Secondary | ICD-10-CM

## 2024-01-27 DIAGNOSIS — Z6831 Body mass index (BMI) 31.0-31.9, adult: Secondary | ICD-10-CM

## 2024-01-27 DIAGNOSIS — Z8673 Personal history of transient ischemic attack (TIA), and cerebral infarction without residual deficits: Secondary | ICD-10-CM

## 2024-01-27 DIAGNOSIS — E669 Obesity, unspecified: Secondary | ICD-10-CM

## 2024-01-27 DIAGNOSIS — Z0001 Encounter for general adult medical examination with abnormal findings: Secondary | ICD-10-CM

## 2024-01-27 DIAGNOSIS — I10 Essential (primary) hypertension: Secondary | ICD-10-CM

## 2024-01-27 DIAGNOSIS — F17210 Nicotine dependence, cigarettes, uncomplicated: Secondary | ICD-10-CM

## 2024-01-27 DIAGNOSIS — E1165 Type 2 diabetes mellitus with hyperglycemia: Secondary | ICD-10-CM

## 2024-01-27 DIAGNOSIS — Z125 Encounter for screening for malignant neoplasm of prostate: Secondary | ICD-10-CM

## 2024-01-27 LAB — CBC WITH DIFFERENTIAL/PLATELET
Basophils Absolute: 0 10*3/uL (ref 0.0–0.1)
Basophils Relative: 0.7 % (ref 0.0–3.0)
Eosinophils Absolute: 0.4 10*3/uL (ref 0.0–0.7)
Eosinophils Relative: 5.2 % — ABNORMAL HIGH (ref 0.0–5.0)
HCT: 46.2 % (ref 39.0–52.0)
Hemoglobin: 15.4 g/dL (ref 13.0–17.0)
Lymphocytes Relative: 35.4 % (ref 12.0–46.0)
Lymphs Abs: 2.6 10*3/uL (ref 0.7–4.0)
MCHC: 33.4 g/dL (ref 30.0–36.0)
MCV: 87.3 fl (ref 78.0–100.0)
Monocytes Absolute: 0.6 10*3/uL (ref 0.1–1.0)
Monocytes Relative: 7.9 % (ref 3.0–12.0)
Neutro Abs: 3.7 10*3/uL (ref 1.4–7.7)
Neutrophils Relative %: 50.8 % (ref 43.0–77.0)
Platelets: 180 10*3/uL (ref 150.0–400.0)
RBC: 5.29 Mil/uL (ref 4.22–5.81)
RDW: 13.7 % (ref 11.5–15.5)
WBC: 7.3 10*3/uL (ref 4.0–10.5)

## 2024-01-27 LAB — MICROALBUMIN / CREATININE URINE RATIO
Creatinine,U: 124.4 mg/dL
Microalb Creat Ratio: 8.8 mg/g (ref 0.0–30.0)
Microalb, Ur: 1.1 mg/dL (ref 0.0–1.9)

## 2024-01-27 LAB — COMPREHENSIVE METABOLIC PANEL WITH GFR
ALT: 23 U/L (ref 0–53)
AST: 17 U/L (ref 0–37)
Albumin: 4.3 g/dL (ref 3.5–5.2)
Alkaline Phosphatase: 66 U/L (ref 39–117)
BUN: 14 mg/dL (ref 6–23)
CO2: 29 meq/L (ref 19–32)
Calcium: 9.5 mg/dL (ref 8.4–10.5)
Chloride: 106 meq/L (ref 96–112)
Creatinine, Ser: 0.95 mg/dL (ref 0.40–1.50)
GFR: 89.48 mL/min (ref >60.00)
Glucose, Bld: 97 mg/dL (ref 70–99)
Potassium: 4 meq/L (ref 3.5–5.1)
Sodium: 142 meq/L (ref 135–145)
Total Bilirubin: 0.6 mg/dL (ref 0.2–1.2)
Total Protein: 6.9 g/dL (ref 6.0–8.3)

## 2024-01-27 LAB — LIPID PANEL
Cholesterol: 98 mg/dL (ref 0–200)
HDL: 36.1 mg/dL — ABNORMAL LOW (ref >39.00)
LDL Cholesterol: 50 mg/dL (ref 0–99)
NonHDL: 62.23
Total CHOL/HDL Ratio: 3
Triglycerides: 62 mg/dL (ref 0.0–149.0)
VLDL: 12.4 mg/dL (ref 0.0–40.0)

## 2024-01-27 LAB — PSA: PSA: 1.44 ng/mL (ref 0.10–4.00)

## 2024-01-27 LAB — HEMOGLOBIN A1C: Hgb A1c MFr Bld: 6.1 % (ref 4.6–6.5)

## 2024-01-27 LAB — TSH: TSH: 1.73 u[IU]/mL (ref 0.35–5.50)

## 2024-01-27 MED ORDER — ACCU-CHEK SOFTCLIX LANCET DEV KIT
1.0000 | PACK | Freq: Three times a day (TID) | 0 refills | Status: AC
  Filled 2024-01-27 – 2024-05-08 (×2): qty 1, 30d supply, fill #0

## 2024-01-27 MED ORDER — METFORMIN HCL 500 MG PO TABS
500.0000 mg | ORAL_TABLET | Freq: Two times a day (BID) | ORAL | 3 refills | Status: AC
  Filled 2024-01-27: qty 60, 30d supply, fill #0

## 2024-01-27 MED ORDER — LOSARTAN POTASSIUM 25 MG PO TABS
25.0000 mg | ORAL_TABLET | Freq: Every day | ORAL | 3 refills | Status: AC
  Filled 2024-01-27: qty 90, 90d supply, fill #0
  Filled 2024-05-08: qty 60, 60d supply, fill #0
  Filled 2024-09-01: qty 60, 60d supply, fill #1

## 2024-01-27 MED ORDER — ACCU-CHEK SOFTCLIX LANCETS MISC
1.0000 | Freq: Three times a day (TID) | 0 refills | Status: AC
  Filled 2024-01-27 – 2024-05-08 (×2): qty 100, 30d supply, fill #0

## 2024-01-27 MED ORDER — BLOOD GLUCOSE TEST VI STRP
1.0000 | ORAL_STRIP | Freq: Three times a day (TID) | 3 refills | Status: AC
Start: 2024-01-27 — End: 2024-06-08
  Filled 2024-01-27: qty 100, 34d supply, fill #0
  Filled 2024-05-08: qty 100, 30d supply, fill #0

## 2024-01-27 MED ORDER — ACCU-CHEK GUIDE ME W/DEVICE KIT
1.0000 | PACK | Freq: Three times a day (TID) | 0 refills | Status: AC
  Filled 2024-01-27 – 2024-05-08 (×2): qty 1, 30d supply, fill #0

## 2024-01-27 MED ORDER — BLOOD GLUCOSE MONITORING SUPPL DEVI
1.0000 | Freq: Three times a day (TID) | 0 refills | Status: DC
  Filled 2024-01-27: qty 1, fill #0

## 2024-01-27 MED ORDER — OLMESARTAN MEDOXOMIL 20 MG PO TABS
10.0000 mg | ORAL_TABLET | Freq: Every day | ORAL | 3 refills | Status: AC
  Filled 2024-01-27: qty 15, 30d supply, fill #0

## 2024-01-27 NOTE — Assessment & Plan Note (Signed)
 He is at risk for another stroke due to hypertension, diabetes, and smoking. He has made lifestyle changes, including dietary modifications and weight loss. Stress the importance of continued risk factor management. Encourage continued use of aspirin , atorvastatin , and clopidogrel  for prevention. Discuss the importance of smoking cessation and encourage the use of extra virgin olive oil and omega supplements in his diet.

## 2024-01-27 NOTE — Assessment & Plan Note (Signed)
 Encouraged cessation he is making progress

## 2024-01-27 NOTE — Assessment & Plan Note (Signed)
 Previously, his hemoglobin A1c was 8.0%, indicating poor glycemic control. He reports significant lifestyle changes and weight loss, suggesting improvement. He is not on glucose-lowering medications due to the cost and side effects of Ozempic . Order a repeat hemoglobin A1c to assess current control. Discuss starting metformin as a cost-effective option and consider continuous glucose monitoring. Encourage continued lifestyle modifications, including diet and exercise.

## 2024-01-27 NOTE — Patient Instructions (Signed)
 VISIT SUMMARY:  Today, we focused on managing your diabetes, hypertension, and overall health. We discussed your recent lifestyle changes and their impact on your health, and we planned several steps to help you maintain and improve your well-being.  YOUR PLAN:  -TYPE 2 DIABETES MELLITUS: Type 2 diabetes is a condition where your body does not use insulin properly, leading to high blood sugar levels. We will check your hemoglobin A1c today to see how well your lifestyle changes are working. We also discussed starting metformin, a cost-effective medication, and possibly using a continuous glucose monitor. Keep up with your diet and exercise.  -HYPERTENSION: Hypertension, or high blood pressure, can strain your heart and blood vessels. It's important to take your full dose of losartan  daily, as high blood pressure often has no symptoms. We will also start home blood pressure monitoring and may increase your losartan  dose if needed. Reducing salt in your diet and using garlic can help manage your blood pressure.  -CEREBROVASCULAR ACCIDENT (CVA): A cerebrovascular accident, or stroke, happens when blood flow to part of your brain is blocked. You are at risk due to your hypertension, diabetes, and smoking. Continue taking aspirin , atorvastatin , and clopidogrel  to prevent another stroke. Quitting smoking is crucial, and using extra virgin olive oil and omega supplements in your diet can help.  -CIGARETTE SMOKING: Smoking increases your risk of stroke and other health issues. You have reduced your smoking, which is great, but quitting completely is important. We discussed the challenges of quitting, especially with a partner who smokes, and provided support and resources to help you stop smoking.  -GENERAL HEALTH MAINTENANCE: You are due for routine vaccinations and cancer screenings. These preventive measures are important for your overall health. We recommend getting shingles and pneumonia vaccinations and  screening for colon cancer with a colonoscopy or Cologuard.  INSTRUCTIONS:  Please follow up in six months or sooner if possible. We will check your blood work and urine tests to assess your health status. Monitor your blood pressure and glucose levels at home regularly.  Building Your Long-Term Health Plan  During today's preventive visit, we covered a variety of important health checks to help you stay on top of your well-being.  We also discussed strategies to maintain your health and identified some areas that might benefit from further exploration.   Preventive care visits like today's are designed to be proactive, but sometimes additional attention may be needed.  Rest assured, we're here for you.  If these areas require further evaluation or management, we'd be happy to schedule a separate, focused appointment to address them in detail.  Addressing Next Steps  [x]   Follow-up Visit: To ensure we address any unresolved issues and continue monitoring your overall health, we recommend scheduling a follow-up appointment in 1 year for your next preventive care visit. If you experience any new problems, need to discuss any medical concerns, or your condition worsens before then, please don't hesitate to call our office to schedule an appointment or seek emergency care as needed.  [x]   Preventive Measures: Maintaining healthy habits plays a crucial role in overall wellness. We recommend considering these tips: [x]   Regular appointments with dental and vision professionals [x]   Nightly nasal saline mist to keep sinuses clear [x]   Consistent toothbrushing to maintain oral health [x]   Using an app like SnoreLab to track sleep quality [x]   Routine checks of blood pressure and heart rate [x]   Medical Information: In some instances, we may require additional medical information  from other providers to create a comprehensive picture of your health. If applicable, we can provide a medical information  release form at the front desk for you to sign, allowing us  to gather these records. [x]   Lab Tests: If any lab tests were ordered today, scheduling them within a week of your visit helps ensure the best possible insurance coverage.  Planning Follow Up to Work on a Problem? Make the Most of Our Focused (20 minute) Appointments  [x]   Clearly state your top concerns at the beginning of the visit to focus our discussion [x]   If you anticipate you will need more time, please inform the front desk during scheduling - we can book multiple appointments in the same week. [x]   If you have transportation problems- use our convenient video appointments or ask about transportation support. [x]   We can get down to business faster if you use MyChart to update information before the visit and submit non-urgent questions before your visit. Thank you for taking the time to provide details through MyChart.  Let our nurse know and she can import this information into your encounter documents.  Arrival and Wait Times  [x]   Arriving on time ensures that everyone receives prompt attention. [x]   Early morning (8a) and afternoon (1p) appointments tend to have shortest wait times. [x]   Unfortunately, we cannot delay appointments for late arrivals or hold slots during phone calls.  Bring to Your Next Appointment:  [x]   Medications: Please bring all your medication bottles to your next appointment to ensure we have an accurate record of your prescriptions. [x]   Health Diaries: If you're monitoring any health conditions at home, keeping a diary of your readings can be very helpful for discussions at your next appointment.  Reviewing Your Records  [x]   Review your attached preventive care information at the end of these patient instructions. [x]   Review this early draft of your clinical encounter notes below and the final encounter summary tomorrow on MyChart after its been completed.      Getting Answers and  Following Up  [x]   Simple Questions & Concerns: For quick questions or basic follow-up after your visit, reach us  at [phone] or MyChart messaging. [x]   Complex Concerns: If your concern is more complex, scheduling an appointment might be best. Discuss this with the staff to find the most suitable option. [x]   Lab & Imaging Results: We'll contact you directly if results are abnormal or you don't use MyChart. Most normal results will be on MyChart within 2-3 business days, with a review message from Dr. Boston Byers. Haven't heard back in 2 weeks? Need results sooner? Contact us  at [phone]. [x]   Referrals: Our referral coordinator will manage specialist referrals. The specialist's office should contact you within 2 weeks to schedule an appointment. Call us  if you haven't heard from them after 2 weeks.  Staying Connected  [x]   MyChart: Activate your MyChart for the fastest way to access results and message us . See the last page of this paperwork for instructions on how to activate.  Billing  [x]   X-ray & Lab Orders: These are billed by separate companies. Contact the invoicing company directly for questions or concerns. [x]   Visit Charges: Discuss any billing inquiries with our administrative services team.  Your Satisfaction Matters  [x]   Share Your Experience: We strive for your satisfaction! If you have any complaints, or preferably compliments, please let Dr. Boston Byers know directly or contact our Practice Administrators, Olinda Bertrand or [employer],  by asking at the front desk.                 Next Steps  [x]   Schedule Follow-Up:  We recommend a follow-up appointment in 1 year for your next wellness visit.  If you develop any new problems, want to address any medical issues, or your condition worsens before then, please call us  for an appointment or seek emergency care. [x]   Preventive Care:  Make sure to keep regular appointments with dental and vision  professionals, use nightly nasal saline mist sprays to keep your sinuses clear and toothbrushing to protect your teeth. Use SnoreLab App or other app to track your sleep quality. Check blood pressure and heart rate routinely. [x]   Medical Information Release:  For any relevant medical information we don't have, please sign a release form at the front desk so we can obtain it for your records. [x]   Lab Tests:  Schedule any lab tests from today for within a week to ensure best insurance coverage.    Making the Most of Our Focused (20 minute) Appointments:  [x]   Clearly state your top concerns at the beginning of the visit to focus our discussion [x]   If you anticipate you will need more time, please inform the front desk during scheduling - we can book multiple appointments in the same week. [x]   If you have transportation problems- use our convenient video appointments or ask about transportation support. [x]   We can get down to business faster if you use MyChart to update information before the visit and submit non-urgent questions before your visit. Thank you for taking the time to provide details through MyChart.  Let our nurse know and she can import this information into your encounter documents.  Arrival and Wait Times: [x]   Arriving on time ensures that everyone receives prompt attention. [x]   Early morning (8a) and afternoon (1p) appointments tend to have shortest wait times. [x]   Unfortunately, we cannot delay appointments for late arrivals or hold slots during phone calls.  Bring to Your Next Appointment  [x]   Medications: Please bring all your medication bottles to your next appointment to ensure we have an accurate record of your prescriptions. [x]   Health Diaries: If you're monitoring any health conditions at home, keeping a diary of your readings can be very helpful for discussions at your next appointment.  Reviewing Your Records  [x]   Review your attached preventive care  information at the end of these patient instructions. [x]   Review this early draft of your clinical encounter notes below and the final encounter summary tomorrow on MyChart after its been completed.   Type 2 diabetes mellitus with hyperglycemia, without long-term current use of insulin (HCC) -     Ambulatory referral to Ophthalmology -     Blood Glucose Test; Use to test blood glucose in the morning, at noon, and at bedtime.  Dispense: 100 strip; Refill: 3 -     Accu-Chek Softclix Lancet Dev; Use to check blood glucose in the morning, at noon, and at bedtime.  Dispense: 1 kit; Refill: 0 -     Accu-Chek Softclix Lancets; Use 1 each in the morning, at noon, and at bedtime.  Dispense: 100 each; Refill: 0 -     metFORMIN HCl; Take 1 tablet (500 mg total) by mouth 2 (two) times daily with a meal.  Dispense: 180 tablet; Refill: 3 -     Accu-Chek Guide Me; Use to check blood glucose in the morning, at  noon, and at bedtime.  Dispense: 1 kit; Refill: 0 -     CBC with Differential/Platelet; Future -     Comprehensive metabolic panel with GFR; Future -     Hemoglobin A1c; Future -     Lipid panel; Future -     Microalbumin / creatinine urine ratio; Future  Hypertension, unspecified type -     Losartan  Potassium; Take 1 tablet (25 mg total) by mouth daily.  Dispense: 90 tablet; Refill: 3 -     Olmesartan Medoxomil; Take 0.5 tablets (10 mg total) by mouth daily. Replaces losartan   Dispense: 90 tablet; Refill: 3  Cigarette smoker  Screening for malignant neoplasm of prostate -     PSA; Future  Obesity (BMI 30-39.9) -     TSH; Future  History of stroke  Encounter for annual general medical examination with abnormal findings in adult     Getting Answers and Following Up  [x]   Simple Questions & Concerns: For quick questions or basic follow-up after your visit, reach us  at [phone] or MyChart messaging. [x]   Complex Concerns: If your concern is more complex, scheduling an appointment might  be best. Discuss this with the staff to find the most suitable option. [x]   Lab & Imaging Results: We'll contact you directly if results are abnormal or you don't use MyChart. Most normal results will be on MyChart within 2-3 business days, with a review message from Dr. Boston Byers. Haven't heard back in 2 weeks? Need results sooner? Contact us  at [phone]. [x]   Referrals: Our referral coordinator will manage specialist referrals. The specialist's office should contact you within 2 weeks to schedule an appointment. Call us  if you haven't heard from them after 2 weeks.  Staying Connected  [x]   MyChart: Activate your MyChart for the fastest way to access results and message us . See the last page of this paperwork for instructions on how to activate.  Billing  [x]   X-ray & Lab Orders: These are billed by separate companies. Contact the invoicing company directly for questions or concerns. [x]   Visit Charges: Discuss any billing inquiries with our administrative services team.  Your Satisfaction Matters  [x]   Share Your Experience: We strive for your satisfaction! If you have any complaints, or preferably compliments, please let Dr. Boston Byers know directly or contact our Practice Administrators, Olinda Bertrand or [employer], by asking at the front desk.

## 2024-01-27 NOTE — Progress Notes (Signed)
 ==============================  [facility]: [phone]   -- Medical Office Visit --  Patient: Michael Ritter      Age: 58 y.o.       Sex:  male  Date:   01/27/2024 Today's Healthcare Provider: Anthon Kins, MD  ==============================   Chief Complaint: He reported he wanted follow up for problems instead although was scheduled as Comprehensive Physical Exam (CPE) preventive care annual visit initially. We did both a problem based and Comprehensive Physical Exam (CPE) preventive care annual visit  Discussed the use of AI scribe software for clinical note transcription with the patient, who gave verbal consent to proceed.  History of Present Illness 58 year old male with diabetes and hypertension who presents for a problem-focused visit to address his diabetes management.  Six months ago, his blood sugar was recorded at 166 mg/dL, and his hemoglobin U9W was 8.0%. He has since made significant lifestyle changes, including dietary modifications and weight loss, and is not currently using insulin or any other diabetes medications. He is open to checking his A1c today to assess the effectiveness of his lifestyle changes.  He has a history of hypertension and is currently taking losartan  25 mg, but only takes half a pill at night as needed, based on his perception of his blood pressure. He does not regularly monitor his blood pressure at home. No side effects from losartan , and he only takes half a pill because it seems to be working, though he is unsure if he needs it.  He has a history of stroke but has not experienced any recent events. He continues to work and reports no impact on his job performance. He has made dietary changes, including using olive oil and taking various vitamins, to reduce his stroke risk.  He is a smoker but has significantly reduced his cigarette consumption to about a pack every two days. He is working on quitting and  has cut back significantly. His partner also smokes, which he acknowledges makes quitting more challenging.  No known sleep apnea and his sleep has improved with weight loss and dietary changes. He no longer snores, which his partner has also noticed.  A comprehensive ROS was negative for any concerning symptoms.  Lab Results  Component Value Date   HGBA1C 6.1 01/27/2024   HGBA1C 8.0 (H) 08/14/2023    Health Maintenance Due  Topic Date Due   Pneumococcal Vaccine 37-4 Years old (1 of 2 - PCV) Never done   Colonoscopy  Never done   Zoster Vaccines- Shingrix (1 of 2) Never done  Defers/declines recommendations today, and has full decision making capacity vaccines Got Cologuard and didn't do it, doesn't want another.   Wt Readings from Last 3 Encounters:  01/27/24 253 lb 12.8 oz (115.1 kg)  09/22/23 274 lb 12.8 oz (124.6 kg)  08/19/23 289 lb 12.8 oz (131.5 kg)     Updated Problem List Entries: Problem  Cigarette Smoker   01/27/2024: Continues to smoke despite reduction in intake. Contributing to stroke and cardiovascular risk. Using mental changes only   History of Stroke   Acute CVA (I63.9) - Updated 08/28/2023: Recent stroke on 08/14/23 confirmed by MRI showing 1.5 cm acute infarct in left internal capsule. CTA shows cervical and intracranial atherosclerosis. Echo reveals moderate LVH, grade I diastolic dysfunction, and mild ascending aortic dilation (38mm). On dual antiplatelet therapy. Residual symptoms include coughing fits and speech difficulties. Plan: Continue current therapy and emphasize blood pressure control.  Background Reviewed: Problem List: has Thyroid nodule; History of stroke; Cigarette smoker; Elevated blood pressure reading; DM type 2 (diabetes mellitus, type 2) (HCC); Diabetic nephropathy (HCC); Hyperlipidemia; Central adiposity; and Onychomycosis on their problem list. Past Medical History:  has a past medical history of Acute CVA (cerebrovascular accident)  (HCC) (08/14/2023), Cigarette smoker (08/15/2023), Diabetic nephropathy (HCC) (08/24/2023), DM type 2 (diabetes mellitus, type 2) (HCC) (08/15/2023), Elevated blood pressure reading (08/15/2023), Hyperlipidemia, Hypertension, Hypoxia (08/15/2023), Thyroid nodule (03/16/2014), and Urinary tract infection. Past Surgical History:   has a past surgical history that includes Vasectomy (2002). Social History:   reports that he has been smoking cigarettes. He does not have any smokeless tobacco history on file. He reports that he does not currently use alcohol. He reports that he does not use drugs. Family History:  family history includes Hypertension in his father. Allergies:  is allergic to penicillins.   Medication Reconciliation: Current Outpatient Medications on File Prior to Visit  Medication Sig   aspirin  81 MG chewable tablet Chew 1 tablet (81 mg total) by mouth daily.   atorvastatin  (LIPITOR) 80 MG tablet Take 1 tablet (80 mg total) by mouth daily.   clopidogrel  (PLAVIX ) 75 MG tablet Take 1 tablet (75 mg total) by mouth daily.   ibuprofen  (ADVIL ) 200 MG tablet Take 4 tablets (800 mg total) by mouth every 6 (six) hours as needed for moderate pain (pain score 4-6).   Multiple Vitamin (MULTIVITAMIN WITH MINERALS) TABS tablet Take 1 tablet by mouth daily.   buPROPion  ER (WELLBUTRIN  SR) 100 MG 12 hr tablet Take 1 tablet (100 mg total) by mouth in the morning. (Patient not taking: Reported on 01/27/2024)   ciclopirox  (LOPROX ) 0.77 % cream Apply topically 2 (two) times daily.   clopidogrel  (PLAVIX ) 75 MG tablet Take 1 tablet (75 mg total) by mouth daily. Emergency prescription.   No current facility-administered medications on file prior to visit.   Medications Discontinued During This Encounter  Medication Reason   Semaglutide ,0.25 or 0.5MG /DOS, (OZEMPIC , 0.25 OR 0.5 MG/DOSE,) 2 MG/3ML SOPN Side effect (s)   losartan  (COZAAR ) 25 MG tablet    Blood Glucose Monitoring Suppl DEVI      Physical  Exam:    01/27/2024    8:25 AM 01/27/2024    7:57 AM 09/22/2023    4:02 PM  Vitals with BMI  Height  6' 3   Weight  253 lbs 13 oz   BMI  31.72   Systolic 150 158 696  Diastolic 88 90 101  Pulse  83   Vital signs reviewed.  Nursing notes reviewed. Weight trend reviewed. Physical Exam General Appearance:  No acute distress appreciable.   Well-groomed, healthy-appearing male.  Well proportioned with no abnormal fat distribution.  Good muscle tone. Pulmonary:  Normal work of breathing at rest, no respiratory distress apparent. SpO2: 98 %  Musculoskeletal: All extremities are intact.  Neurological:  Awake, alert, oriented, and engaged.  No obvious focal neurological deficits or cognitive impairments.  Sensorium seems unclouded.   Speech is clear and coherent with logical content. Psychiatric:  Appropriate mood, pleasant and cooperative demeanor, thoughtful and engaged during the exam  Results:    01/27/2024    8:01 AM 09/22/2023    4:01 PM 08/19/2023   10:12 AM  PHQ 2/9 Scores  PHQ - 2 Score 0 0 0  PHQ- 9 Score   2   Results LABS Blood Glucose: 166 mg/dL (29/52/8413) KGM0N: 0.2% (07/29/2023)    Results for orders placed or  performed in visit on 01/27/24  Urine Microalbumin w/creat. ratio  Result Value Ref Range   Microalb, Ur 1.1 0.0 - 1.9 mg/dL   Creatinine,U 161.0 mg/dL   Microalb Creat Ratio 8.8 0.0 - 30.0 mg/g  PSA  Result Value Ref Range   PSA 1.44 0.10 - 4.00 ng/mL  TSH  Result Value Ref Range   TSH 1.73 0.35 - 5.50 uIU/mL  Lipid panel  Result Value Ref Range   Cholesterol 98 0 - 200 mg/dL   Triglycerides 96.0 0.0 - 149.0 mg/dL   HDL 45.40 (L) >98.11 mg/dL   VLDL 91.4 0.0 - 78.2 mg/dL   LDL Cholesterol 50 0 - 99 mg/dL   Total CHOL/HDL Ratio 3    NonHDL 62.23   Hemoglobin A1c  Result Value Ref Range   Hgb A1c MFr Bld 6.1 4.6 - 6.5 %  Comprehensive metabolic panel with GFR  Result Value Ref Range   Sodium 142 135 - 145 mEq/L   Potassium 4.0 3.5 - 5.1 mEq/L    Chloride 106 96 - 112 mEq/L   CO2 29 19 - 32 mEq/L   Glucose, Bld 97 70 - 99 mg/dL   BUN 14 6 - 23 mg/dL   Creatinine, Ser 9.56 0.40 - 1.50 mg/dL   Total Bilirubin 0.6 0.2 - 1.2 mg/dL   Alkaline Phosphatase 66 39 - 117 U/L   AST 17 0 - 37 U/L   ALT 23 0 - 53 U/L   Total Protein 6.9 6.0 - 8.3 g/dL   Albumin 4.3 3.5 - 5.2 g/dL   GFR 21.30 >86.57 mL/min   Calcium  9.5 8.4 - 10.5 mg/dL  CBC with Differential/Platelet  Result Value Ref Range   WBC 7.3 4.0 - 10.5 K/uL   RBC 5.29 4.22 - 5.81 Mil/uL   Hemoglobin 15.4 13.0 - 17.0 g/dL   HCT 84.6 96.2 - 95.2 %   MCV 87.3 78.0 - 100.0 fl   MCHC 33.4 30.0 - 36.0 g/dL   RDW 84.1 32.4 - 40.1 %   Platelets 180.0 150.0 - 400.0 K/uL   Neutrophils Relative % 50.8 43.0 - 77.0 %   Lymphocytes Relative 35.4 12.0 - 46.0 %   Monocytes Relative 7.9 3.0 - 12.0 %   Eosinophils Relative 5.2 (H) 0.0 - 5.0 %   Basophils Relative 0.7 0.0 - 3.0 %   Neutro Abs 3.7 1.4 - 7.7 K/uL   Lymphs Abs 2.6 0.7 - 4.0 K/uL   Monocytes Absolute 0.6 0.1 - 1.0 K/uL   Eosinophils Absolute 0.4 0.0 - 0.7 K/uL   Basophils Absolute 0.0 0.0 - 0.1 K/uL   Office Visit on 01/27/2024  Component Date Value Ref Range Status   Microalb, Ur 01/27/2024 1.1  0.0 - 1.9 mg/dL Final   Creatinine,U 02/72/5366 124.4  mg/dL Final   Microalb Creat Ratio 01/27/2024 8.8  0.0 - 30.0 mg/g Final   PSA 01/27/2024 1.44  0.10 - 4.00 ng/mL Final   TSH 01/27/2024 1.73  0.35 - 5.50 uIU/mL Final   Cholesterol 01/27/2024 98  0 - 200 mg/dL Final   Triglycerides 44/10/4740 62.0  0.0 - 149.0 mg/dL Final   HDL 59/56/3875 36.10 (L)  >64.33 mg/dL Final   VLDL 29/51/8841 12.4  0.0 - 40.0 mg/dL Final   LDL Cholesterol 01/27/2024 50  0 - 99 mg/dL Final   Total CHOL/HDL Ratio 01/27/2024 3   Final   NonHDL 01/27/2024 62.23   Final   Hgb A1c MFr Bld 01/27/2024 6.1  4.6 -  6.5 % Final   Sodium 01/27/2024 142  135 - 145 mEq/L Final   Potassium 01/27/2024 4.0  3.5 - 5.1 mEq/L Final   Chloride 01/27/2024 106  96 -  112 mEq/L Final   CO2 01/27/2024 29  19 - 32 mEq/L Final   Glucose, Bld 01/27/2024 97  70 - 99 mg/dL Final   BUN 80/99/8338 14  6 - 23 mg/dL Final   Creatinine, Ser 01/27/2024 0.95  0.40 - 1.50 mg/dL Final   Total Bilirubin 01/27/2024 0.6  0.2 - 1.2 mg/dL Final   Alkaline Phosphatase 01/27/2024 66  39 - 117 U/L Final   AST 01/27/2024 17  0 - 37 U/L Final   ALT 01/27/2024 23  0 - 53 U/L Final   Total Protein 01/27/2024 6.9  6.0 - 8.3 g/dL Final   Albumin 25/12/3974 4.3  3.5 - 5.2 g/dL Final   GFR 73/41/9379 89.48  >60.00 mL/min Final   Calcium  01/27/2024 9.5  8.4 - 10.5 mg/dL Final   WBC 02/40/9735 7.3  4.0 - 10.5 K/uL Final   RBC 01/27/2024 5.29  4.22 - 5.81 Mil/uL Final   Hemoglobin 01/27/2024 15.4  13.0 - 17.0 g/dL Final   HCT 32/99/2426 46.2  39.0 - 52.0 % Final   MCV 01/27/2024 87.3  78.0 - 100.0 fl Final   MCHC 01/27/2024 33.4  30.0 - 36.0 g/dL Final   RDW 83/41/9622 13.7  11.5 - 15.5 % Final   Platelets 01/27/2024 180.0  150.0 - 400.0 K/uL Final   Neutrophils Relative % 01/27/2024 50.8  43.0 - 77.0 % Final   Lymphocytes Relative 01/27/2024 35.4  12.0 - 46.0 % Final   Monocytes Relative 01/27/2024 7.9  3.0 - 12.0 % Final   Eosinophils Relative 01/27/2024 5.2 (H)  0.0 - 5.0 % Final   Basophils Relative 01/27/2024 0.7  0.0 - 3.0 % Final   Neutro Abs 01/27/2024 3.7  1.4 - 7.7 K/uL Final   Lymphs Abs 01/27/2024 2.6  0.7 - 4.0 K/uL Final   Monocytes Absolute 01/27/2024 0.6  0.1 - 1.0 K/uL Final   Eosinophils Absolute 01/27/2024 0.4  0.0 - 0.7 K/uL Final   Basophils Absolute 01/27/2024 0.0  0.0 - 0.1 K/uL Final  Office Visit on 08/19/2023  Component Date Value Ref Range Status   Microalb, Ur 08/19/2023 2.9 (H)  0.0 - 1.9 mg/dL Final   Creatinine,U 29/79/8921 199.9  mg/dL Final   Microalb Creat Ratio 08/19/2023 1.4  0.0 - 30.0 mg/g Final  Admission on 08/14/2023, Discharged on 08/15/2023  Component Date Value Ref Range Status   Glucose-Capillary 08/14/2023 169 (H)  70 - 99 mg/dL  Final   Prothrombin Time 08/14/2023 12.8  11.4 - 15.2 seconds Final   INR 08/14/2023 0.9  0.8 - 1.2 Final   aPTT 08/14/2023 24  24 - 36 seconds Final   WBC 08/14/2023 10.0  4.0 - 10.5 K/uL Final   RBC 08/14/2023 5.54  4.22 - 5.81 MIL/uL Final   Hemoglobin 08/14/2023 16.8  13.0 - 17.0 g/dL Final   HCT 19/41/7408 50.5  39.0 - 52.0 % Final   MCV 08/14/2023 91.2  80.0 - 100.0 fL Final   MCH 08/14/2023 30.3  26.0 - 34.0 pg Final   MCHC 08/14/2023 33.3  30.0 - 36.0 g/dL Final   RDW 14/48/1856 13.5  11.5 - 15.5 % Final   Platelets 08/14/2023 226  150 - 400 K/uL Final   nRBC 08/14/2023 0.0  0.0 - 0.2 % Final  Neutrophils Relative % 08/14/2023 59  % Final   Neutro Abs 08/14/2023 6.1  1.7 - 7.7 K/uL Final   Lymphocytes Relative 08/14/2023 29  % Final   Lymphs Abs 08/14/2023 2.9  0.7 - 4.0 K/uL Final   Monocytes Relative 08/14/2023 7  % Final   Monocytes Absolute 08/14/2023 0.7  0.1 - 1.0 K/uL Final   Eosinophils Relative 08/14/2023 3  % Final   Eosinophils Absolute 08/14/2023 0.3  0.0 - 0.5 K/uL Final   Basophils Relative 08/14/2023 1  % Final   Basophils Absolute 08/14/2023 0.1  0.0 - 0.1 K/uL Final   Immature Granulocytes 08/14/2023 1  % Final   Abs Immature Granulocytes 08/14/2023 0.05  0.00 - 0.07 K/uL Final   Sodium 08/14/2023 135  135 - 145 mmol/L Final   Potassium 08/14/2023 3.8  3.5 - 5.1 mmol/L Final   Chloride 08/14/2023 103  98 - 111 mmol/L Final   CO2 08/14/2023 24  22 - 32 mmol/L Final   Glucose, Bld 08/14/2023 171 (H)  70 - 99 mg/dL Final   BUN 40/98/1191 15  6 - 20 mg/dL Final   Creatinine, Ser 08/14/2023 0.96  0.61 - 1.24 mg/dL Final   Calcium  08/14/2023 9.5  8.9 - 10.3 mg/dL Final   Total Protein 47/82/9562 8.2 (H)  6.5 - 8.1 g/dL Final   Albumin 13/03/6577 4.4  3.5 - 5.0 g/dL Final   AST 46/96/2952 30  15 - 41 U/L Final   ALT 08/14/2023 35  0 - 44 U/L Final   Alkaline Phosphatase 08/14/2023 68  38 - 126 U/L Final   Total Bilirubin 08/14/2023 0.7  0.0 - 1.2 mg/dL Final    GFR, Estimated 08/14/2023 >60  >60 mL/min Final   Anion gap 08/14/2023 8  5 - 15 Final   Alcohol, Ethyl (B) 08/14/2023 <10  <10 mg/dL Final   Sodium 84/13/2440 141  135 - 145 mmol/L Final   Potassium 08/14/2023 4.0  3.5 - 5.1 mmol/L Final   Chloride 08/14/2023 104  98 - 111 mmol/L Final   BUN 08/14/2023 14  6 - 20 mg/dL Final   Creatinine, Ser 08/14/2023 1.00  0.61 - 1.24 mg/dL Final   Glucose, Bld 06/08/2535 166 (H)  70 - 99 mg/dL Final   Calcium , Ion 08/14/2023 1.18  1.15 - 1.40 mmol/L Final   TCO2 08/14/2023 24  22 - 32 mmol/L Final   Hemoglobin 08/14/2023 16.0  13.0 - 17.0 g/dL Final   HCT 64/40/3474 47.0  39.0 - 52.0 % Final   Cholesterol 08/14/2023 208 (H)  0 - 200 mg/dL Final   Triglycerides 25/95/6387 106  <150 mg/dL Final   HDL 56/43/3295 46  >40 mg/dL Final   Total CHOL/HDL Ratio 08/14/2023 4.5  RATIO Final   VLDL 08/14/2023 21  0 - 40 mg/dL Final   LDL Cholesterol 08/14/2023 141 (H)  0 - 99 mg/dL Final   Hgb J8A MFr Bld 08/14/2023 8.0 (H)  4.8 - 5.6 % Final   Mean Plasma Glucose 08/14/2023 183  mg/dL Final   HIV Screen 4th Generation wRfx 08/15/2023 Non Reactive  Non Reactive Final   BP 08/14/2023 178/93  mmHg Final   Single Plane A2C EF 08/14/2023 53.1  % Final   Single Plane A4C EF 08/14/2023 61.0  % Final   Calc EF 08/14/2023 57.8  % Final   S' Lateral 08/14/2023 3.50  cm Final   Area-P 1/2 08/14/2023 4.68  cm2 Final   Est EF 08/14/2023 55  Final  No image results found. No results found.       Assessment & Plan Type 2 diabetes mellitus with hyperglycemia, without long-term current use of insulin (HCC) Previously, his hemoglobin A1c was 8.0%, indicating poor glycemic control. He reports significant lifestyle changes and weight loss, suggesting improvement. He is not on glucose-lowering medications due to the cost and side effects of Ozempic . Order a repeat hemoglobin A1c to assess current control. Discuss starting metformin as a cost-effective option and consider  continuous glucose monitoring. Encourage continued lifestyle modifications, including diet and exercise. Hypertension, unspecified type His blood pressure readings are slightly elevated. He takes half of the prescribed losartan  dose due to a perceived lack of symptoms. He was advised that hypertension is often asymptomatic and can cause cardiovascular strain. Emphasize the importance of tighter control to reduce stroke risk. Encourage taking the full dose of losartan  daily and order home blood pressure monitoring. Consider increasing the losartan  dose to 50 mg if needed. Discuss dietary modifications, such as salt reduction and garlic use. Cigarette smoker Encouraged cessation he is making progress  Screening for malignant neoplasm of prostate Age 62-69, average-risk (USPSTF grade B) and Benefits/Risks/Limitations/Costs were reviewed Obesity (BMI 30-39.9) Encouraged weight loss  History of stroke He is at risk for another stroke due to hypertension, diabetes, and smoking. He has made lifestyle changes, including dietary modifications and weight loss. Stress the importance of continued risk factor management. Encourage continued use of aspirin , atorvastatin , and clopidogrel  for prevention. Discuss the importance of smoking cessation and encourage the use of extra virgin olive oil and omega supplements in his diet. Encounter for annual general medical examination with abnormal findings in adult General Health Maintenance   He is due for routine vaccinations and cancer screenings but is resistant. Discuss the importance of these preventive measures. Encourage shingles and pneumonia vaccinations and colon cancer screening via colonoscopy or Cologuard.  Follow-up   He was advised to follow up more frequently due to stroke risk and other complications, but financial constraints limit visits. Discuss the importance of monitoring health and adjusting treatment as needed. Schedule a follow-up in six months or  sooner if possible. Order blood work and urine tests to assess health status and monitor blood pressure and glucose levels at home. Material hardship due to limited financial resources Encouraged patient to monthly follow up but he prefers every 6 months for financial reason.      Orders Placed in Encounter:    losartan  (COZAAR ) 25 MG tablet  Daily         Ambulatory referral to Ophthalmology         Blood Glucose Monitoring Suppl DEVI  3 times daily,   Status:  Discontinued         Glucose Blood (BLOOD GLUCOSE TEST STRIPS) STRP  3 times daily         Lancets Misc. (ACCU-CHEK SOFTCLIX LANCET DEV) KIT  3 times daily         Accu-Chek Softclix Lancets lancets  3 times daily         metFORMIN (GLUCOPHAGE) 500 MG tablet  2 times daily with meals         olmesartan (BENICAR) 20 MG tablet  Daily         CBC with Differential/Platelet  Status:  Canceled         Comprehensive metabolic panel with GFR  Status:  Canceled         Lipid panel  Status:  Canceled  TSH  Status:  Canceled         PSA  Status:  Canceled         Urine Microalbumin w/creat. ratio  Status:  Canceled         Hemoglobin A1c  Status:  Canceled         Blood Glucose Monitoring Suppl (ACCU-CHEK GUIDE ME) w/Device KIT  3 times daily         CBC with Differential/Platelet         Comprehensive metabolic panel with GFR         Hemoglobin A1c         Lipid panel         PSA         TSH         Urine Microalbumin w/creat. ratio            Reviewed/updated/encouraged completion: Immunization History  Administered Date(s) Administered   Tdap 08/12/2012   Health Maintenance Due  Topic Date Due   Pneumococcal Vaccine 51-62 Years old (1 of 2 - PCV) Never done   Colonoscopy  Never done   Zoster Vaccines- Shingrix (1 of 2) Never done   Health Maintenance  Topic Date Due   Pneumococcal Vaccine 82-60 Years old (1 of 2 - PCV) Never done   Colonoscopy  Never done   Zoster Vaccines- Shingrix (1 of  2) Never done   OPHTHALMOLOGY EXAM  01/27/2024 (Originally 07/28/1976)   DTaP/Tdap/Td (2 - Td or Tdap) 08/18/2024 (Originally 08/12/2022)   Hepatitis C Screening  08/18/2024 (Originally 07/28/1984)   INFLUENZA VACCINE  03/12/2024   HEMOGLOBIN A1C  07/28/2024   FOOT EXAM  09/21/2024   Diabetic kidney evaluation - eGFR measurement  01/26/2025   Diabetic kidney evaluation - Urine ACR  01/26/2025   HIV Screening  Completed   HPV VACCINES  Aged Out   Meningococcal B Vaccine  Aged Out   COVID-19 Vaccine  Discontinued   Reviewed the following verbally with patient and provided AVS materials:  HEALTH MAINTENANCE COUNSELING AND ANTICIPATORY GUIDANCE   Preventive Measure Recommendation  Eye Exams Every 1-2 years  Dental Care Cleanings every 6 months or more, brush/floss 3x daily  Sinus Care Saline spray rinses daily  Sleep 8 hours nightly, good sleep hygiene, e-monitoring if any daytime drowsiness  Diet Fruits/vegetables/fiber/healthy fats, balance and moderation  Exercise 150 minutes weekly  Risk Behaviors Discouraged any/all high risk behaviors   CANCER SCREENING SHARED DECISION MAKING   Penile/Testicle/Scrotum Encouraged self-monitoring and reporting of genital abnormalities. Patient reports none.  Thyroid Checked and advised to palpate thyroid for nodules  Prostate Individualized risks/benefits/costs discussed Lab Results  Component Value Date   PSA 1.44 01/27/2024     Colon HM Colonoscopy          Current Care Gaps     Colonoscopy (Every 10 Years) Never done   No completion history exists for this topic.   Trguidrf volohustf              Lung Current guidelines recommend individuals aged 33 to 82 who currently smoke or formerly smoked and have a >= 20 pack-year smoking history should undergo annual screening with low-dose computed tomography (LDCT). Tobacco Use: High Risk (01/27/2024)   Patient History    Smoking Tobacco Use: Every Day    Smokeless Tobacco Use:  Unknown    Passive Exposure: Not on file   Social History   Tobacco Use  Smoking Status Every Day  Types: Cigarettes  Smokeless Tobacco Not on file  Tobacco Comments   Has decreased smoking a lot.    Skin Advised regular sunscreen use. Patient denies worrisome, changing, or new skin lesions. Offered to include images in chart for surveillance. Showed patient these pictures of melanomas for reference to educate for self-monitoring.  Other Cancers Discussed lack of screening guidelines and insurance coverage for other cancer types.   Health Maintenance, Male Adopting a healthy lifestyle and getting preventive care are important in promoting health and wellness. Ask your health care provider about: The right schedule for you to have regular tests and exams. Things you can do on your own to prevent diseases and keep yourself healthy. What should I know about diet, weight, and exercise? Eat a healthy diet  Eat a diet that includes plenty of vegetables, fruits, low-fat dairy products, and lean protein. Do not eat a lot of foods that are high in solid fats, added sugars, or sodium. Maintain a healthy weight Body mass index (BMI) is a measurement that can be used to identify possible weight problems. It estimates body fat based on height and weight. Your health care provider can help determine your BMI and help you achieve or maintain a healthy weight. Get regular exercise Get regular exercise. This is one of the most important things you can do for your health. Most adults should: Exercise for at least 150 minutes each week. The exercise should increase your heart rate and make you sweat (moderate-intensity exercise). Do strengthening exercises at least twice a week. This is in addition to the moderate-intensity exercise. Spend less time sitting. Even light physical activity can be beneficial. Watch cholesterol and blood lipids Have your blood tested for lipids and cholesterol at 57 years  of age, then have this test every 5 years. You may need to have your cholesterol levels checked more often if: Your lipid or cholesterol levels are high. You are older than 57 years of age. You are at high risk for heart disease. What should I know about cancer screening? Many types of cancers can be detected early and may often be prevented. Depending on your health history and family history, you may need to have cancer screening at various ages. This may include screening for: Colorectal cancer. Prostate cancer. Skin cancer. Lung cancer. What should I know about heart disease, diabetes, and high blood pressure? Blood pressure and heart disease High blood pressure causes heart disease and increases the risk of stroke. This is more likely to develop in people who have high blood pressure readings or are overweight. Talk with your health care provider about your target blood pressure readings. Have your blood pressure checked: Every 3-5 years if you are 75-81 years of age. Every year if you are 45 years old or older. If you are between the ages of 62 and 75 and are a current or former smoker, ask your health care provider if you should have a one-time screening for abdominal aortic aneurysm (AAA). Diabetes Have regular diabetes screenings. This checks your fasting blood sugar level. Have the screening done: Once every three years after age 37 if you are at a normal weight and have a low risk for diabetes. More often and at a younger age if you are overweight or have a high risk for diabetes. What should I know about preventing infection? Hepatitis B If you have a higher risk for hepatitis B, you should be screened for this virus. Talk with your health care provider to  find out if you are at risk for hepatitis B infection. Hepatitis C Blood testing is recommended for: Everyone born from [birth year] through 1964. Anyone with known risk factors for hepatitis C. Sexually transmitted infections  (STIs) You should be screened each year for STIs, including gonorrhea and chlamydia, if: You are sexually active and are younger than 57 years of age. You are older than 57 years of age and your health care provider tells you that you are at risk for this type of infection. Your sexual activity has changed since you were last screened, and you are at increased risk for chlamydia or gonorrhea. Ask your health care provider if you are at risk. Ask your health care provider about whether you are at high risk for HIV. Your health care provider may recommend a prescription medicine to help prevent HIV infection. If you choose to take medicine to prevent HIV, you should first get tested for HIV. You should then be tested every 3 months for as long as you are taking the medicine. Follow these instructions at home: Alcohol use Do not drink alcohol if your health care provider tells you not to drink. If you drink alcohol: Limit how much you have to 0-2 drinks a day. Know how much alcohol is in your drink. In the [place] one drink equals one 12 oz bottle of beer (355 mL), one 5 oz glass of wine (148 mL), or one 1 oz glass of hard liquor (44 mL). Lifestyle Do not use any products that contain nicotine or tobacco. These products include cigarettes, chewing tobacco, and vaping devices, such as e-cigarettes. If you need help quitting, ask your health care provider. Do not use street drugs. Do not share needles. Ask your health care provider for help if you need support or information about quitting drugs. General instructions Schedule regular health, dental, and eye exams. Stay current with your vaccines. Tell your health care provider if: You often feel depressed. You have ever been abused or do not feel safe at home. Summary Adopting a healthy lifestyle and getting preventive care are important in promoting health and wellness. Follow your health care provider's instructions about healthy diet,  exercising, and getting tested or screened for diseases. Follow your health care provider's instructions on monitoring your cholesterol and blood pressure. This information is not intended to replace advice given to you by your health care provider. Make sure you discuss any questions you have with your health care provider. Document Revised: 12/18/2020 Document Reviewed: 12/18/2020 Elsevier Patient Education  2024 [employer].     This document was synthesized by artificial intelligence ([employer]) using HIPAA-compliant recording of the clinical interaction;   We discussed the use of AI scribe software for clinical note transcription with the patient, who gave verbal consent to proceed. additional Info: This encounter employed state-of-the-art, real-time, collaborative documentation. The patient actively reviewed and assisted in updating their electronic medical record on a shared screen, ensuring transparency and facilitating joint problem-solving for the problem list, overview, and plan. This approach promotes accurate, informed care. The treatment plan was discussed and reviewed in detail, including medication safety, potential side effects, and all patient questions. We confirmed understanding and comfort with the plan. Follow-up instructions were established, including contacting the office for any concerns, returning if symptoms worsen, persist, or new symptoms develop, and precautions for potential emergency department visits.

## 2024-01-28 ENCOUNTER — Other Ambulatory Visit (HOSPITAL_COMMUNITY): Payer: Self-pay

## 2024-02-09 ENCOUNTER — Other Ambulatory Visit (HOSPITAL_COMMUNITY): Payer: Self-pay

## 2024-05-08 ENCOUNTER — Other Ambulatory Visit (HOSPITAL_COMMUNITY): Payer: Self-pay

## 2024-07-28 ENCOUNTER — Ambulatory Visit: Payer: Self-pay | Admitting: Internal Medicine

## 2024-09-01 ENCOUNTER — Other Ambulatory Visit (HOSPITAL_COMMUNITY): Payer: Self-pay
